# Patient Record
Sex: Male | Born: 1970 | Race: Black or African American | Hispanic: No | Marital: Single | Smoking: Current every day smoker
Health system: Southern US, Community
[De-identification: ages and names within clinical notes are randomized; demographics above are authoritative.]

## PROBLEM LIST (undated history)

## (undated) DIAGNOSIS — J189 Pneumonia, unspecified organism: Secondary | ICD-10-CM

## (undated) DIAGNOSIS — G20A1 Parkinson's disease without dyskinesia, without mention of fluctuations: Secondary | ICD-10-CM

## (undated) DIAGNOSIS — F952 Tourette's disorder: Secondary | ICD-10-CM

## (undated) DIAGNOSIS — M5430 Sciatica, unspecified side: Secondary | ICD-10-CM

## (undated) DIAGNOSIS — K219 Gastro-esophageal reflux disease without esophagitis: Secondary | ICD-10-CM

## (undated) DIAGNOSIS — I10 Essential (primary) hypertension: Secondary | ICD-10-CM

## (undated) DIAGNOSIS — M1990 Unspecified osteoarthritis, unspecified site: Secondary | ICD-10-CM

## (undated) DIAGNOSIS — G20 Parkinson's disease: Secondary | ICD-10-CM

## (undated) DIAGNOSIS — R0600 Dyspnea, unspecified: Secondary | ICD-10-CM

## (undated) DIAGNOSIS — F32A Depression, unspecified: Secondary | ICD-10-CM

## (undated) DIAGNOSIS — R519 Headache, unspecified: Secondary | ICD-10-CM

## (undated) HISTORY — PX: HERNIA REPAIR: SHX51

## (undated) HISTORY — DX: Parkinson's disease: G20

---

## 1998-04-23 ENCOUNTER — Emergency Department (HOSPITAL_COMMUNITY): Admission: EM | Admit: 1998-04-23 | Discharge: 1998-04-23 | Payer: Self-pay | Admitting: Emergency Medicine

## 1998-06-11 ENCOUNTER — Emergency Department (HOSPITAL_COMMUNITY): Admission: EM | Admit: 1998-06-11 | Discharge: 1998-06-11 | Payer: Self-pay | Admitting: Emergency Medicine

## 1998-06-28 ENCOUNTER — Ambulatory Visit (HOSPITAL_BASED_OUTPATIENT_CLINIC_OR_DEPARTMENT_OTHER): Admission: RE | Admit: 1998-06-28 | Discharge: 1998-06-28 | Payer: Self-pay | Admitting: General Surgery

## 2000-11-24 ENCOUNTER — Emergency Department (HOSPITAL_COMMUNITY): Admission: EM | Admit: 2000-11-24 | Discharge: 2000-11-24 | Payer: Self-pay | Admitting: Emergency Medicine

## 2003-04-30 ENCOUNTER — Emergency Department (HOSPITAL_COMMUNITY): Admission: EM | Admit: 2003-04-30 | Discharge: 2003-04-30 | Payer: Self-pay | Admitting: Emergency Medicine

## 2008-12-12 ENCOUNTER — Emergency Department (HOSPITAL_COMMUNITY): Admission: EM | Admit: 2008-12-12 | Discharge: 2008-12-12 | Payer: Self-pay | Admitting: Emergency Medicine

## 2010-02-12 ENCOUNTER — Emergency Department (HOSPITAL_COMMUNITY): Admission: EM | Admit: 2010-02-12 | Discharge: 2010-02-12 | Payer: Self-pay | Admitting: Emergency Medicine

## 2010-07-31 IMAGING — CR DG FINGER THUMB 2+V*L*
4 series · 4 of 4 positions shown · non-contrast
Comparison: None.

CLINICAL DATA: Pain swelling and redness with no known injury

LEFT THUMB 2+V

[x finger pa left *]
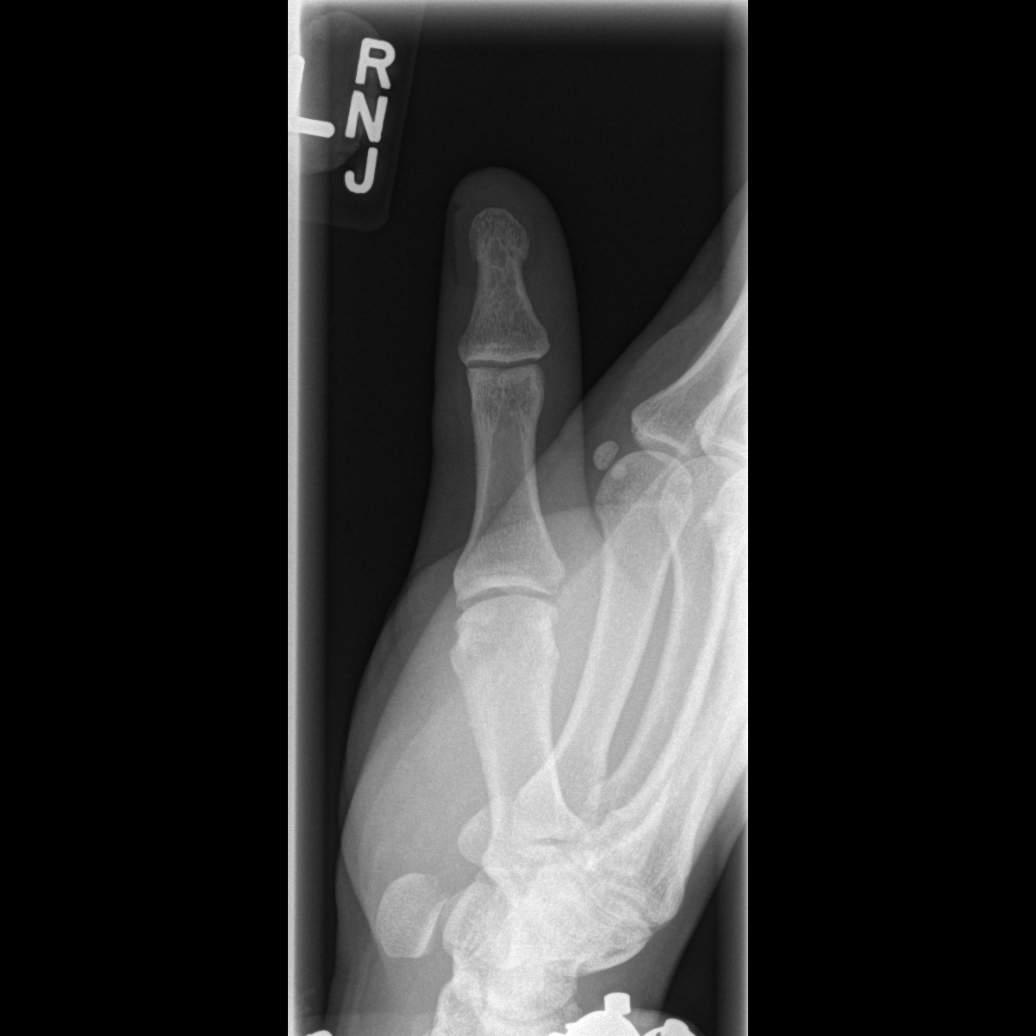

[x finger obl. left (1 of 2)]
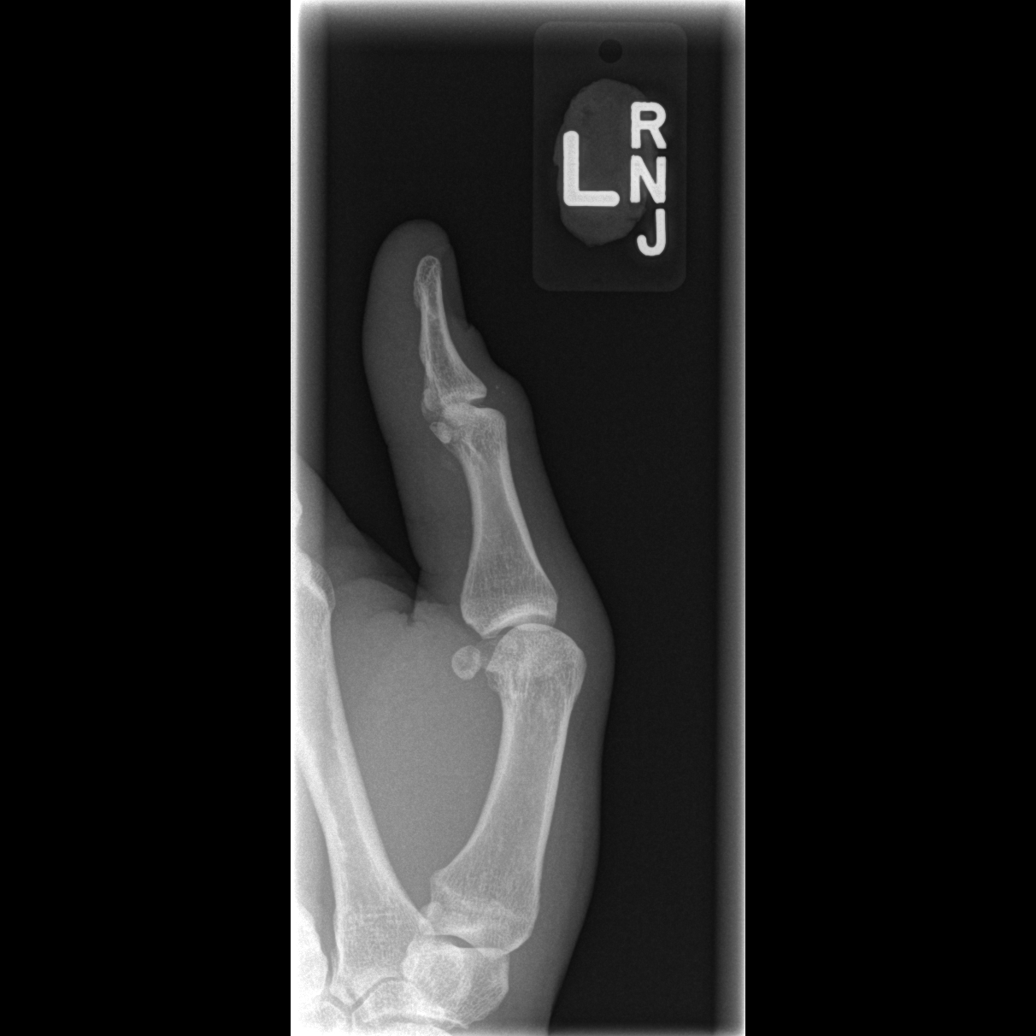

[x finger lateral left]
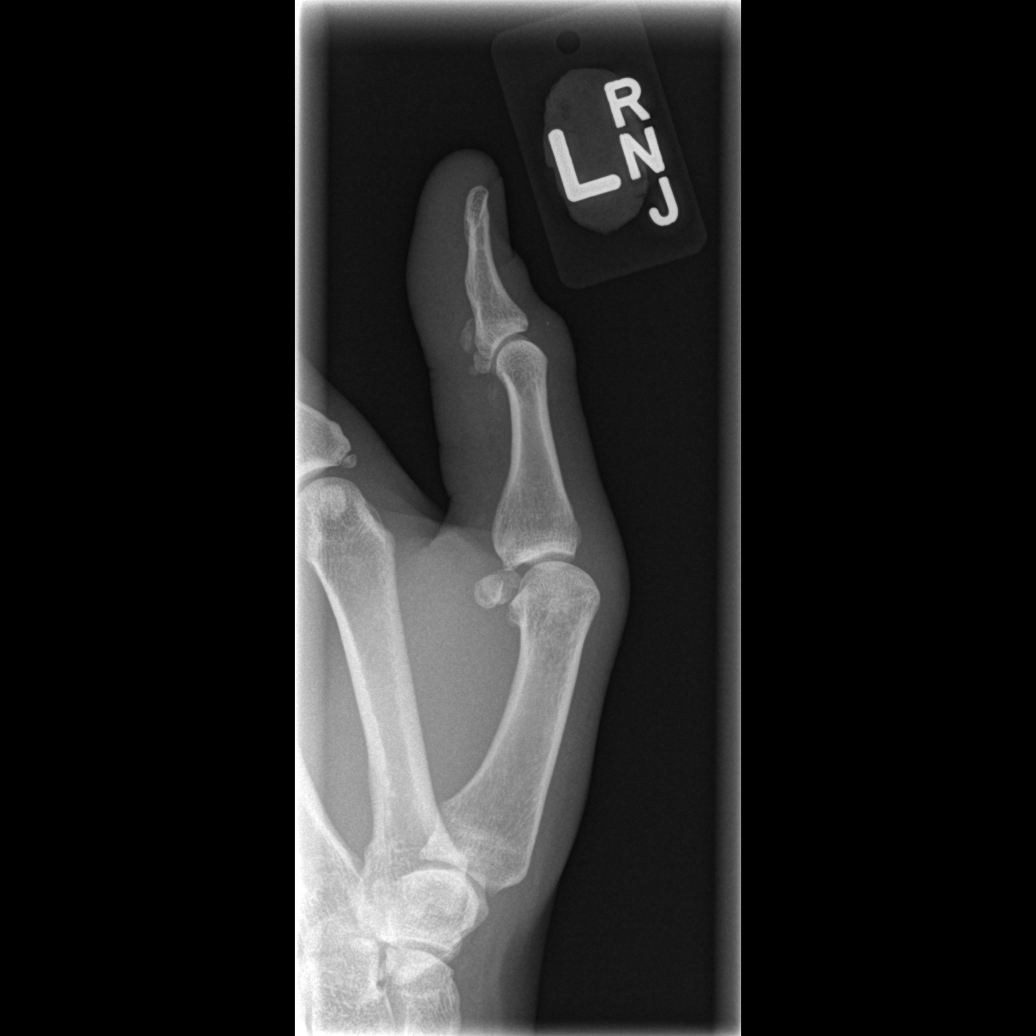

[x finger obl. left (2 of 2)]
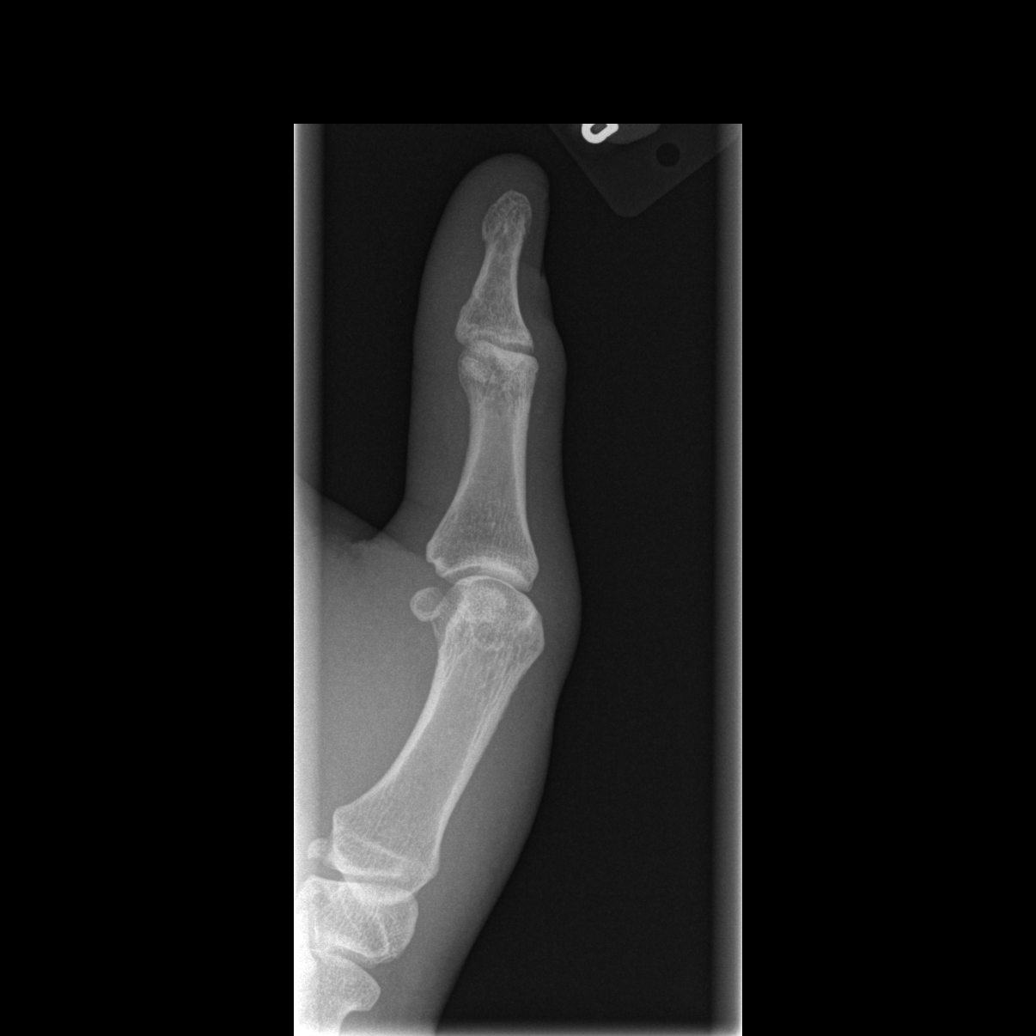

[4 of 4 positions shown; findings below may reference images not displayed]

FINDINGS: No bony abnormality.  On the lateral view, there is soft
tissue swelling over the IP joint.  There is a possible small
radiopaque foreign body in the soft tissues that may be glass.
This could be an artifact but needs clinical correlation.  A
lateral view of the thumb could be repeated to see if this density
persists.  However it is suspicious for piece of glass in the soft
tissues.
IMPRESSION: Possible small piece of glass in the soft tissues dorsal to the
interphalangeal joint of the thumb.  Otherwise normal.  See report.

## 2010-11-18 LAB — CBC
Hemoglobin: 13.6 g/dL (ref 13.0–17.0)
MCHC: 32.8 g/dL (ref 30.0–36.0)
RBC: 4.53 MIL/uL (ref 4.22–5.81)
RDW: 14.9 % (ref 11.5–15.5)

## 2010-11-18 LAB — DIFFERENTIAL
Basophils Relative: 1 % (ref 0–1)
Eosinophils Absolute: 0.3 10*3/uL (ref 0.0–0.7)
Eosinophils Relative: 3 % (ref 0–5)
Lymphocytes Relative: 18 % (ref 12–46)
Monocytes Absolute: 1.4 10*3/uL — ABNORMAL HIGH (ref 0.1–1.0)
Monocytes Relative: 14 % — ABNORMAL HIGH (ref 3–12)
Neutro Abs: 6.9 10*3/uL (ref 1.7–7.7)

## 2010-12-01 ENCOUNTER — Emergency Department (HOSPITAL_COMMUNITY)
Admission: EM | Admit: 2010-12-01 | Discharge: 2010-12-01 | Disposition: A | Discharge status: Home / Self Care | Payer: Self-pay | Attending: Emergency Medicine | Admitting: Emergency Medicine

## 2010-12-01 DIAGNOSIS — H6120 Impacted cerumen, unspecified ear: Secondary | ICD-10-CM | POA: Insufficient documentation

## 2010-12-01 DIAGNOSIS — I10 Essential (primary) hypertension: Secondary | ICD-10-CM | POA: Insufficient documentation

## 2011-01-26 ENCOUNTER — Emergency Department (HOSPITAL_COMMUNITY)
Admission: EM | Admit: 2011-01-26 | Discharge: 2011-01-26 | Disposition: A | Discharge status: Home / Self Care | Payer: Self-pay | Attending: Emergency Medicine | Admitting: Emergency Medicine

## 2011-01-26 DIAGNOSIS — M549 Dorsalgia, unspecified: Secondary | ICD-10-CM | POA: Insufficient documentation

## 2011-01-26 DIAGNOSIS — I10 Essential (primary) hypertension: Secondary | ICD-10-CM | POA: Insufficient documentation

## 2011-04-02 ENCOUNTER — Emergency Department (HOSPITAL_COMMUNITY): Payer: Self-pay

## 2011-04-02 ENCOUNTER — Emergency Department (HOSPITAL_COMMUNITY)
Admission: EM | Admit: 2011-04-02 | Discharge: 2011-04-02 | Disposition: A | Discharge status: Home / Self Care | Payer: Self-pay | Attending: Emergency Medicine | Admitting: Emergency Medicine

## 2011-04-02 DIAGNOSIS — I10 Essential (primary) hypertension: Secondary | ICD-10-CM | POA: Insufficient documentation

## 2011-04-02 DIAGNOSIS — R079 Chest pain, unspecified: Secondary | ICD-10-CM | POA: Insufficient documentation

## 2011-04-02 DIAGNOSIS — Y9367 Activity, basketball: Secondary | ICD-10-CM | POA: Insufficient documentation

## 2011-04-02 DIAGNOSIS — Y92838 Other recreation area as the place of occurrence of the external cause: Secondary | ICD-10-CM | POA: Insufficient documentation

## 2011-04-02 DIAGNOSIS — S298XXA Other specified injuries of thorax, initial encounter: Secondary | ICD-10-CM | POA: Insufficient documentation

## 2011-04-02 DIAGNOSIS — X58XXXA Exposure to other specified factors, initial encounter: Secondary | ICD-10-CM | POA: Insufficient documentation

## 2011-04-02 DIAGNOSIS — Y9239 Other specified sports and athletic area as the place of occurrence of the external cause: Secondary | ICD-10-CM | POA: Insufficient documentation

## 2011-06-16 ENCOUNTER — Emergency Department (HOSPITAL_COMMUNITY)
Admission: EM | Admit: 2011-06-16 | Discharge: 2011-06-16 | Disposition: A | Discharge status: Home / Self Care | Payer: Self-pay | Attending: Emergency Medicine | Admitting: Emergency Medicine

## 2011-06-16 DIAGNOSIS — I10 Essential (primary) hypertension: Secondary | ICD-10-CM | POA: Insufficient documentation

## 2011-06-16 DIAGNOSIS — S61209A Unspecified open wound of unspecified finger without damage to nail, initial encounter: Secondary | ICD-10-CM | POA: Insufficient documentation

## 2011-06-16 DIAGNOSIS — M79609 Pain in unspecified limb: Secondary | ICD-10-CM | POA: Insufficient documentation

## 2011-06-16 DIAGNOSIS — IMO0002 Reserved for concepts with insufficient information to code with codable children: Secondary | ICD-10-CM | POA: Insufficient documentation

## 2011-06-16 DIAGNOSIS — W278XXA Contact with other nonpowered hand tool, initial encounter: Secondary | ICD-10-CM | POA: Insufficient documentation

## 2011-09-18 IMAGING — CR DG RIBS W/ CHEST 3+V*R*
5 series · 5 of 5 positions shown · non-contrast
Comparison: None

CLINICAL DATA: Injury with right chest and rib pain.  Shortness of
breath.

RIGHT RIBS AND CHEST - 3+ VIEW

[w chest pa (1 of 2)]
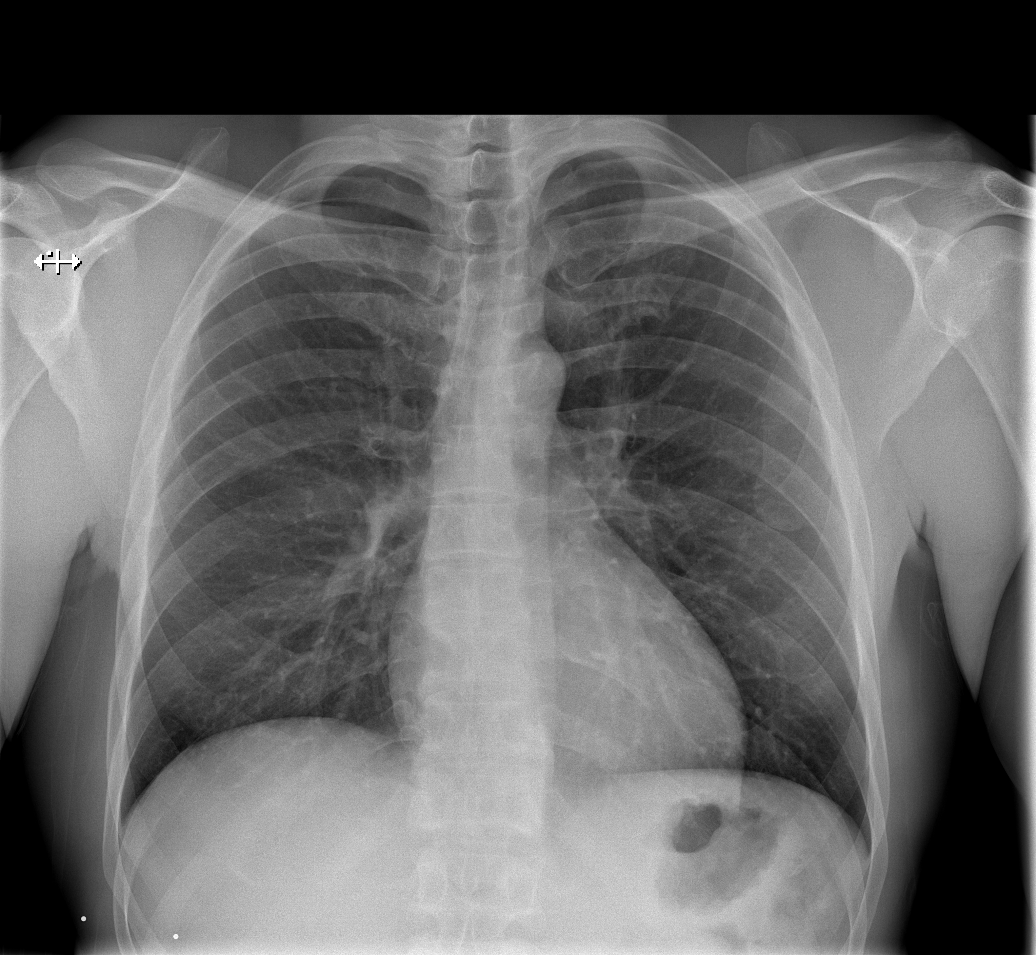

[w chest pa (2 of 2)]
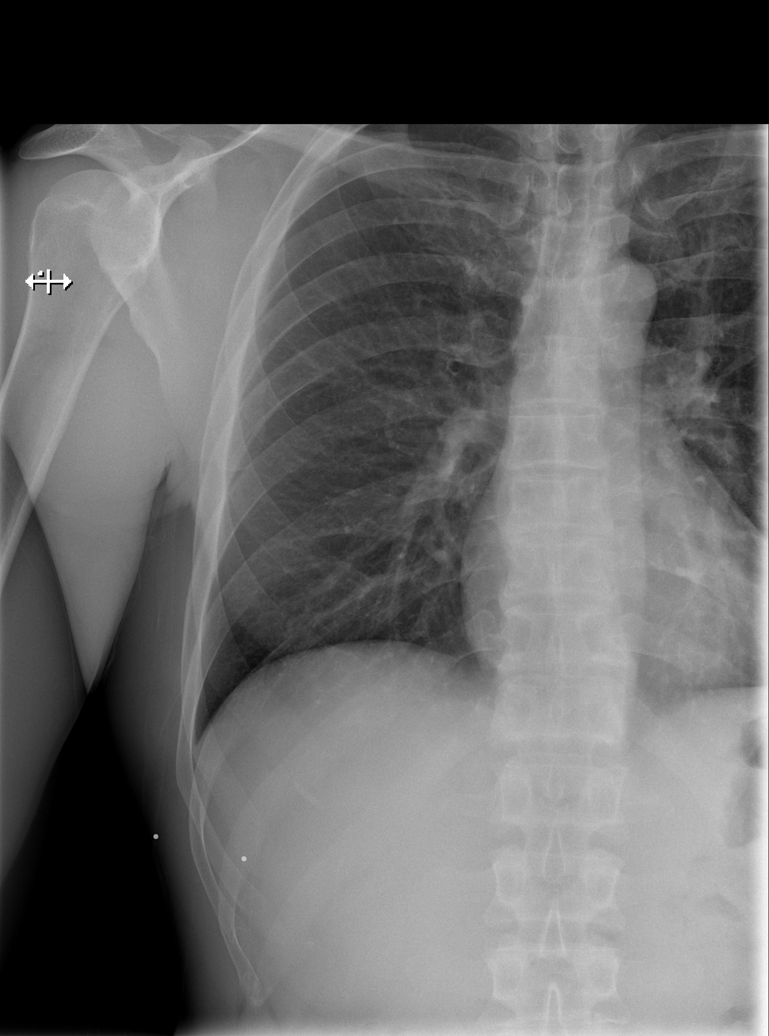

[w ribs ap lower right]
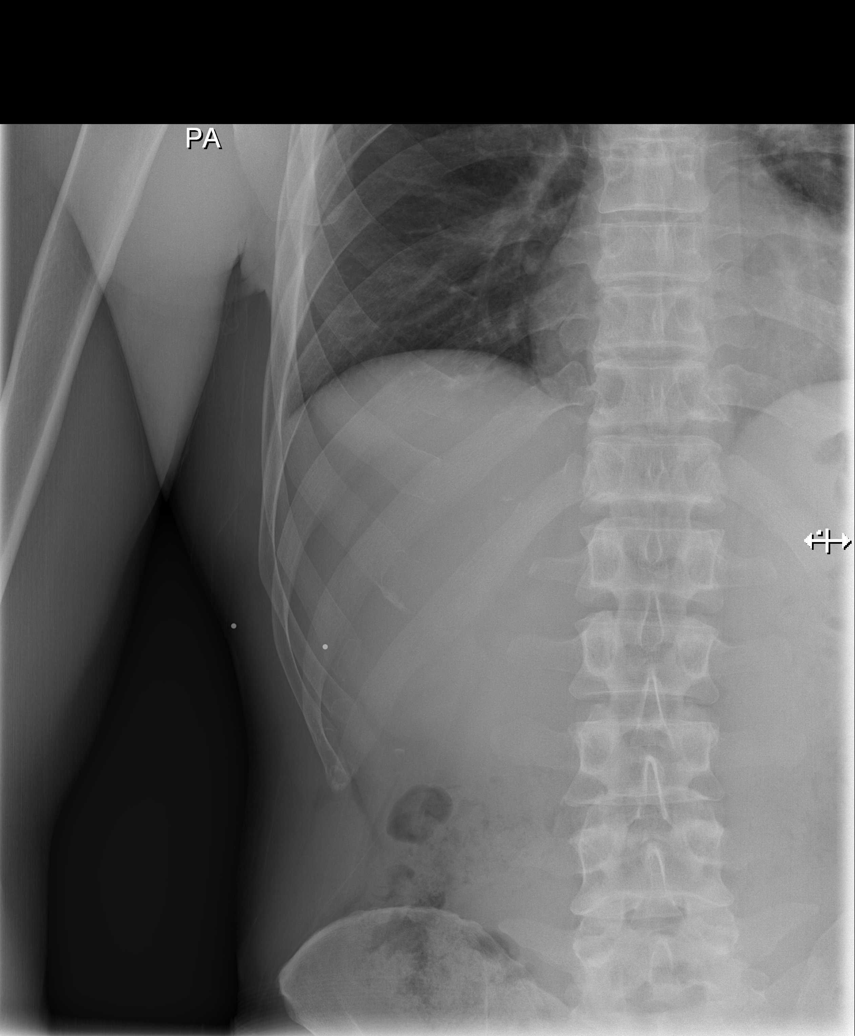

[w ribs obl right (1 of 2)]
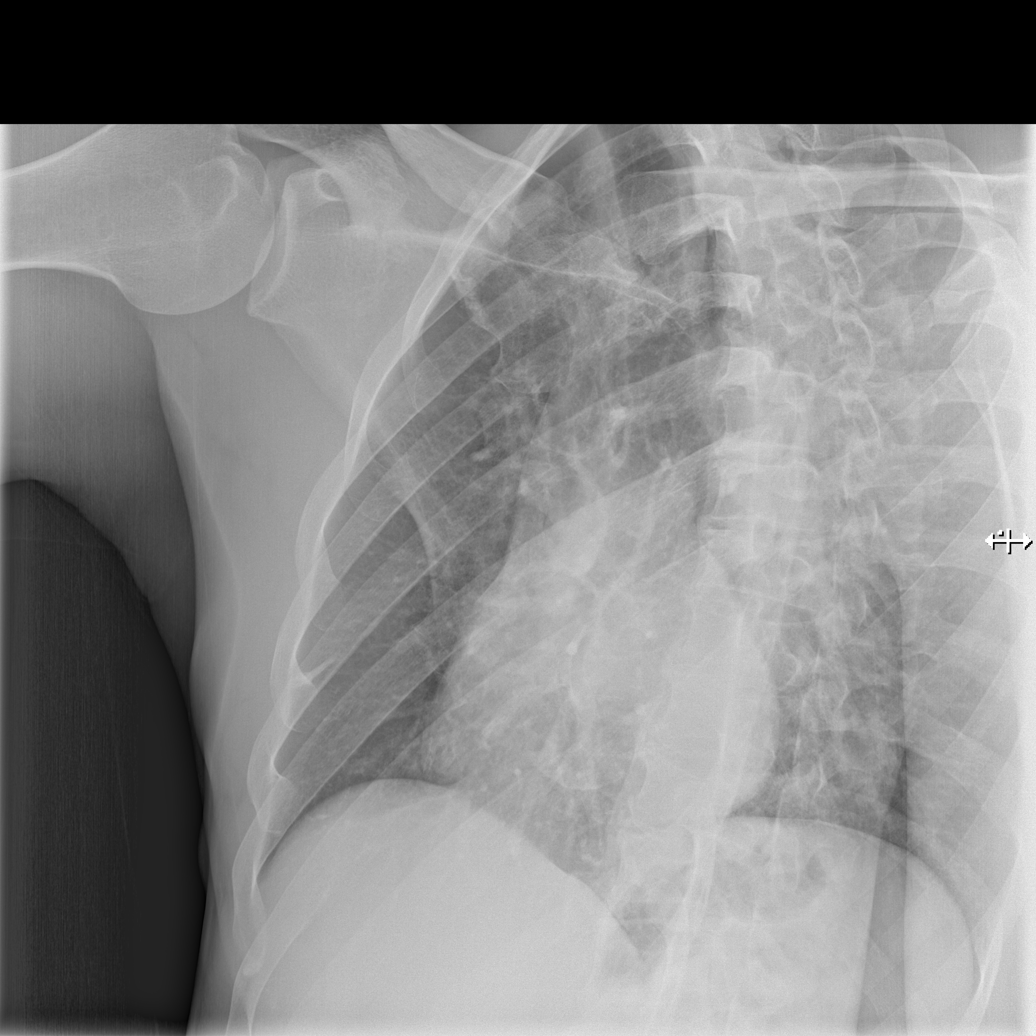

[w ribs obl right (2 of 2)]
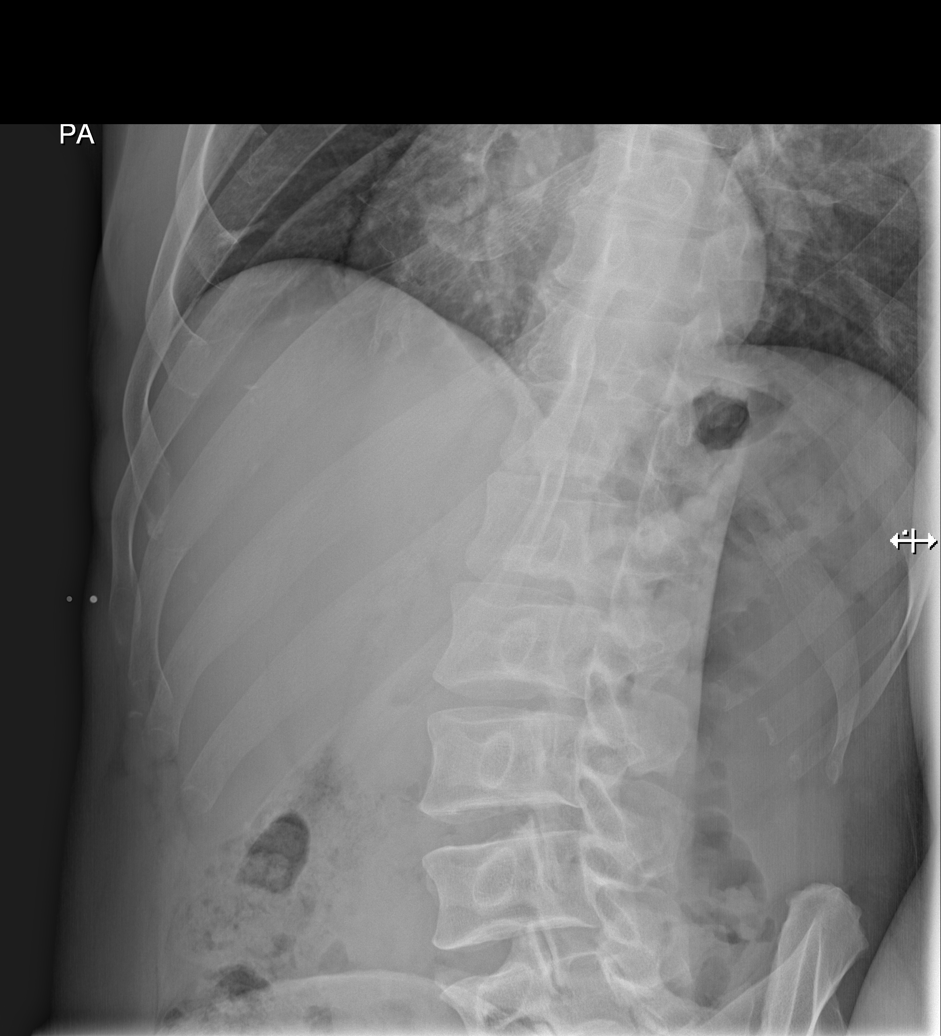

[5 of 5 positions shown; findings below may reference images not displayed]

FINDINGS: The cardiomediastinal silhouette is unremarkable.
The lungs are clear.
There is no evidence of focal airspace disease, pulmonary edema,
pulmonary nodule/mass, pleural effusion, or pneumothorax.
No acute bony abnormalities are identified.
There is no evidence of rib fracture.
IMPRESSION: No evidence of active cardiopulmonary disease or rib fracture.

## 2012-09-08 ENCOUNTER — Encounter (HOSPITAL_COMMUNITY): Payer: Self-pay | Admitting: *Deleted

## 2012-09-08 ENCOUNTER — Emergency Department (HOSPITAL_COMMUNITY)
Admission: EM | Admit: 2012-09-08 | Discharge: 2012-09-08 | Disposition: A | Discharge status: Home / Self Care | Payer: Self-pay | Attending: Emergency Medicine | Admitting: Emergency Medicine

## 2012-09-08 DIAGNOSIS — F17200 Nicotine dependence, unspecified, uncomplicated: Secondary | ICD-10-CM | POA: Insufficient documentation

## 2012-09-08 DIAGNOSIS — K047 Periapical abscess without sinus: Secondary | ICD-10-CM

## 2012-09-08 DIAGNOSIS — R220 Localized swelling, mass and lump, head: Secondary | ICD-10-CM | POA: Insufficient documentation

## 2012-09-08 MED ORDER — HYDROCODONE-ACETAMINOPHEN 5-325 MG PO TABS: 1.0000 | ORAL_TABLET | Freq: Four times a day (QID) | ORAL | Status: DC | PRN

## 2012-09-08 MED ORDER — PENICILLIN V POTASSIUM 250 MG PO TABS: 500.0000 mg | ORAL_TABLET | Freq: Four times a day (QID) | ORAL | Status: DC

## 2012-09-08 MED ORDER — BUPIVACAINE-EPINEPHRINE PF 0.5-1:200000 % IJ SOLN
1.8000 mL | Freq: Once | INTRAMUSCULAR | Status: AC
  Administered 2012-09-08: 9 mg

## 2012-09-08 NOTE — ED Notes (Signed)
PA- Paz at bedside for drainage of dental abscess.

## 2012-09-08 NOTE — ED Provider Notes (Signed)
Medical screening examination/treatment/procedure(s) were performed by non-physician practitioner and as supervising physician I was immediately available for consultation/collaboration.   Charles B. Sheldon, MD 09/08/12 1958 

## 2012-09-08 NOTE — ED Provider Notes (Signed)
History     CSN: [id number]  Arrival date & time 09/08/12  1053   First MD Initiated Contact with Patient 09/08/12 1100      Chief Complaint  Patient presents with  . Dental Pain    (Consider location/radiation/quality/duration/timing/severity/associated sxs/prior treatment) Patient is a 42 y.o. male presenting with tooth pain and abscess.  Dental PainPrimary symptoms do not include mouth pain, dental injury, fever, shortness of breath, sore throat or angioedema.  Additional symptoms include: dental sensitivity to temperature, gum swelling, gum tenderness and purulent gums. Additional symptoms do not include: trismus, jaw pain, facial swelling, trouble swallowing, pain with swallowing, excessive salivation, dry mouth, taste disturbance, smell disturbance, drooling, ear pain, hearing loss, nosebleeds, swollen glands, goiter and fatigue.   Abscess  This is a new problem. The current episode started yesterday. The problem has been gradually worsening. Affected Location: left upper dental. The problem is moderate. The abscess is characterized by swelling and painfulness. Pertinent negatives include no fever and no sore throat. There were no sick contacts. He has received no recent medical care.    History reviewed. No pertinent past medical history.  History reviewed. No pertinent past surgical history.  History reviewed. No pertinent family history.  History  Substance Use Topics  . Smoking status: Current Every Day Smoker    Types: Cigarettes  . Smokeless tobacco: Not on file  . Alcohol Use: No      Review of Systems  Constitutional: Negative for fever and fatigue.  HENT: Positive for dental problem. Negative for hearing loss, ear pain, nosebleeds, sore throat, facial swelling, drooling and trouble swallowing.   Respiratory: Negative for shortness of breath.     Allergies  Shellfish allergy and Tylenol  Home Medications   Current Outpatient Rx  Name  Route  Sig   Dispense  Refill  . IBUPROFEN 200 MG PO TABS   Oral   Take 400 mg by mouth every 6 (six) hours as needed. For pain.           BP 140/75  Pulse 77  Temp 98.9 F (37.2 C) (Oral)  Resp 18  SpO2 100%  Physical Exam  Nursing note and vitals reviewed. Constitutional: He is oriented to person, place, and time. He appears well-developed and well-nourished. No distress.  HENT:  Head: Normocephalic and atraumatic. No trismus in the jaw.  Mouth/Throat: Uvula is midline, oropharynx is clear and moist and mucous membranes are normal. Abnormal dentition. No dental abscesses or uvula swelling. No oropharyngeal exudate, posterior oropharyngeal edema, posterior oropharyngeal erythema or tonsillar abscesses.          Pt able to open and close mouth with out difficulty. Airway intact. Uvula midline. Gingival swelling, tenderness, and fluctuance over affected area- 2cm palpable abscess. No swelling or tenderness of submental and submandibular regions.  Eyes: Conjunctivae normal and EOM are normal.  Neck: Normal range of motion and full passive range of motion without pain. Neck supple.  Cardiovascular: Normal rate and regular rhythm.   Pulmonary/Chest: Effort normal and breath sounds normal. No stridor. No respiratory distress. He has no wheezes.  Musculoskeletal: Normal range of motion.  Lymphadenopathy:       Head (right side): No submental, no submandibular, no tonsillar, no preauricular and no posterior auricular adenopathy present.       Head (left side): No submental, no submandibular, no tonsillar, no preauricular and no posterior auricular adenopathy present.    He has no cervical adenopathy.  Neurological: He is alert and oriented  to person, place, and time.  Skin: Skin is warm and dry. No rash noted. He is not diaphoretic.    ED Course  INCISION AND DRAINAGE Date/Time: 09/08/2012 11:58 AM Performed by: Jaci Carrel Authorized by: Jaci Carrel Consent: Verbal consent obtained. Risks  and benefits: risks, benefits and alternatives were discussed Consent given by: patient Patient understanding: patient states understanding of the procedure being performed Patient identity confirmed: verbally with patient and arm band Type: abscess Body area: mouth Location details: vestibule of mouth Anesthesia: local infiltration Local anesthetic: bupivacaine 0.5% with epinephrine Anesthetic total: 1.8 ml Patient sedated: no Scalpel size: 11 Needle gauge: 18 Incision type: single straight Complexity: simple Drainage: purulent Drainage amount: copious Wound treatment: wound left open Packing material: none Patient tolerance: Patient tolerated the procedure well with no immediate complications.   (including critical care time)  Labs Reviewed - No data to display No results found.   No diagnosis found.    MDM  Dental abscess  Patient with dental abscess amenable to incision and drainage.  Exam unconcerning for Ludwig's angina or spread of infection.  Will treat with penicillin and pain medicine.  Urged patient to follow-up with dentist for wound recheck in 2 days. Encouraged home warm soaks and flushing.          Jaci Carrel, [place] 09/08/12 1202

## 2012-09-08 NOTE — Discharge Instructions (Signed)
Dental Abscess  A dental abscess is a collection of infected fluid (pus) from a bacterial infection in the inner part of the tooth (pulp). It usually occurs at the end of the tooth's root.   CAUSES    Severe tooth decay.   Trauma to the tooth that allows bacteria to enter into the pulp, such as a broken or chipped tooth.  SYMPTOMS    Severe pain in and around the infected tooth.   Swelling and redness around the abscessed tooth or in the mouth or face.   Tenderness.   Pus drainage.   Bad breath.   Bitter taste in the mouth.   Difficulty swallowing.   Difficulty opening the mouth.   Nausea.   Vomiting.   Chills.   Swollen neck glands.  DIAGNOSIS    A medical and dental history will be taken.   An examination will be performed by tapping on the abscessed tooth.   X-rays may be taken of the tooth to identify the abscess.  TREATMENT  The goal of treatment is to eliminate the infection. You may be prescribed antibiotic medicine to stop the infection from spreading. A root canal may be performed to save the tooth. If the tooth cannot be saved, it may be pulled (extracted) and the abscess may be drained.   HOME CARE INSTRUCTIONS   Only take over-the-counter or prescription medicines for pain, fever, or discomfort as directed by your caregiver.   Rinse your mouth (gargle) often with salt water ( tsp salt in 8 oz of warm water) to relieve pain or swelling.   Do not drive after taking pain medicine (narcotics).   Do not apply heat to the outside of your face.   Return to your dentist for further treatment as directed.  SEEK MEDICAL CARE IF:   Your pain is not helped by medicine.   Your pain is getting worse instead of better.  SEEK IMMEDIATE MEDICAL CARE IF:   You have a fever or persistent symptoms for more than 2 3 days.   You have a fever and your symptoms suddenly get worse.   You have chills or a very bad headache.   You have problems breathing or swallowing.   You have trouble opening your  mouth.   You have swelling in the neck or around the eye.  Document Released: 08/18/2005 Document Revised: 02/17/2012 Document Reviewed: 11/26/2010  [employer] Patient Information 2013 [employer].

## 2012-09-08 NOTE — ED Notes (Signed)
Reports left upper dental abscess x 2 days. Airway intact

## 2012-09-08 NOTE — ED Notes (Signed)
Left upper gum toward back molars is swollen and red. Left lateral upper tooth is black in appearance. A&Ox4, ambulatory, nad.

## 2013-06-03 ENCOUNTER — Emergency Department (HOSPITAL_COMMUNITY): Payer: Self-pay

## 2013-06-03 ENCOUNTER — Emergency Department (HOSPITAL_COMMUNITY)
Admission: EM | Admit: 2013-06-03 | Discharge: 2013-06-03 | Disposition: A | Discharge status: Home / Self Care | Payer: Self-pay | Attending: Emergency Medicine | Admitting: Emergency Medicine

## 2013-06-03 ENCOUNTER — Encounter (HOSPITAL_COMMUNITY): Payer: Self-pay | Admitting: Emergency Medicine

## 2013-06-03 DIAGNOSIS — T07XXXA Unspecified multiple injuries, initial encounter: Secondary | ICD-10-CM | POA: Insufficient documentation

## 2013-06-03 DIAGNOSIS — F17200 Nicotine dependence, unspecified, uncomplicated: Secondary | ICD-10-CM | POA: Insufficient documentation

## 2013-06-03 DIAGNOSIS — S5011XA Contusion of right forearm, initial encounter: Secondary | ICD-10-CM

## 2013-06-03 DIAGNOSIS — IMO0002 Reserved for concepts with insufficient information to code with codable children: Secondary | ICD-10-CM | POA: Insufficient documentation
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
## 2013-06-04 NOTE — ED Provider Notes (Signed)
Medical screening examination/treatment/procedure(s) were performed by non-physician practitioner and as supervising physician I was immediately available for consultation/collaboration.  Hurman Horn, MD 06/04/13 [phone]

## 2013-11-19 IMAGING — CR DG HUMERUS 2V *R*
4 series · 4 of 4 positions shown · non-contrast
Comparison: None.

CLINICAL DATA: Arm pain, laceration

EXAM:
RIGHT HUMERUS - 2+ VIEW

[w humerus ap right (1 of 2)]
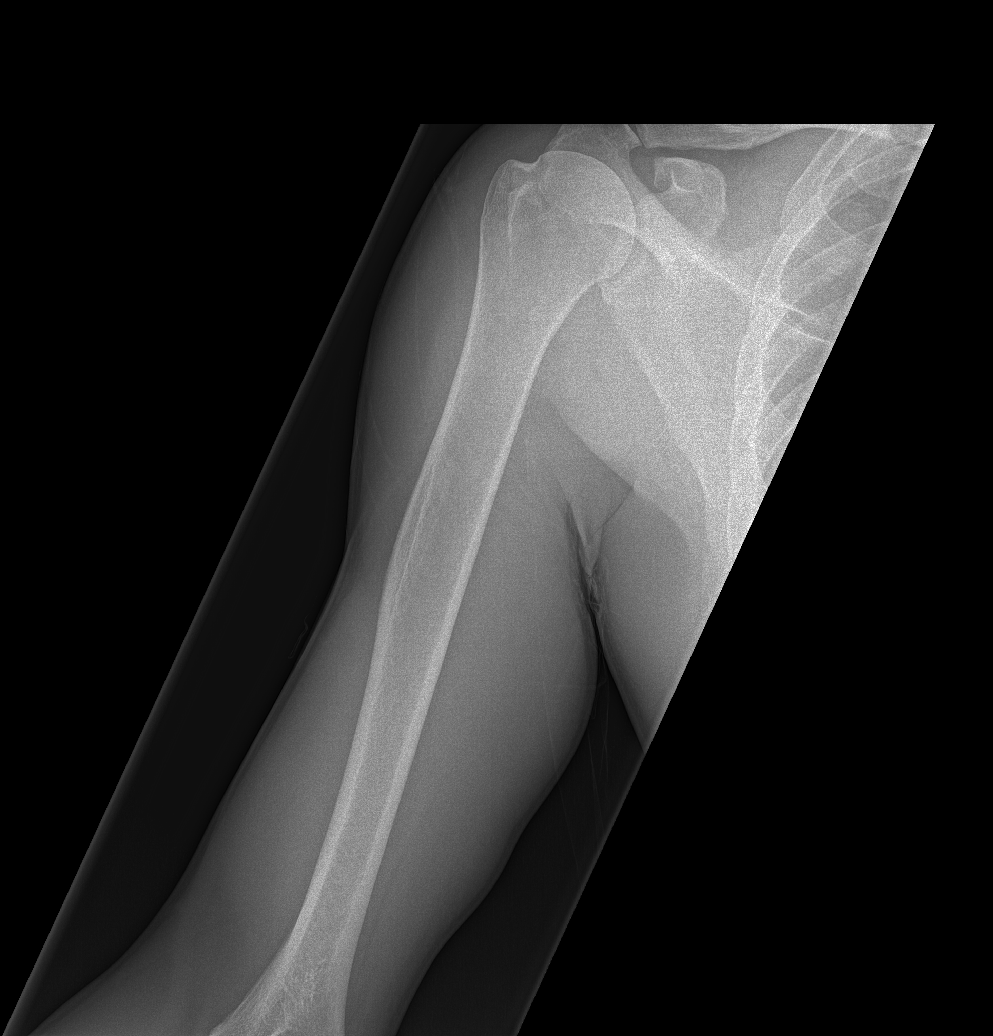

[w humerus ap right (2 of 2)]
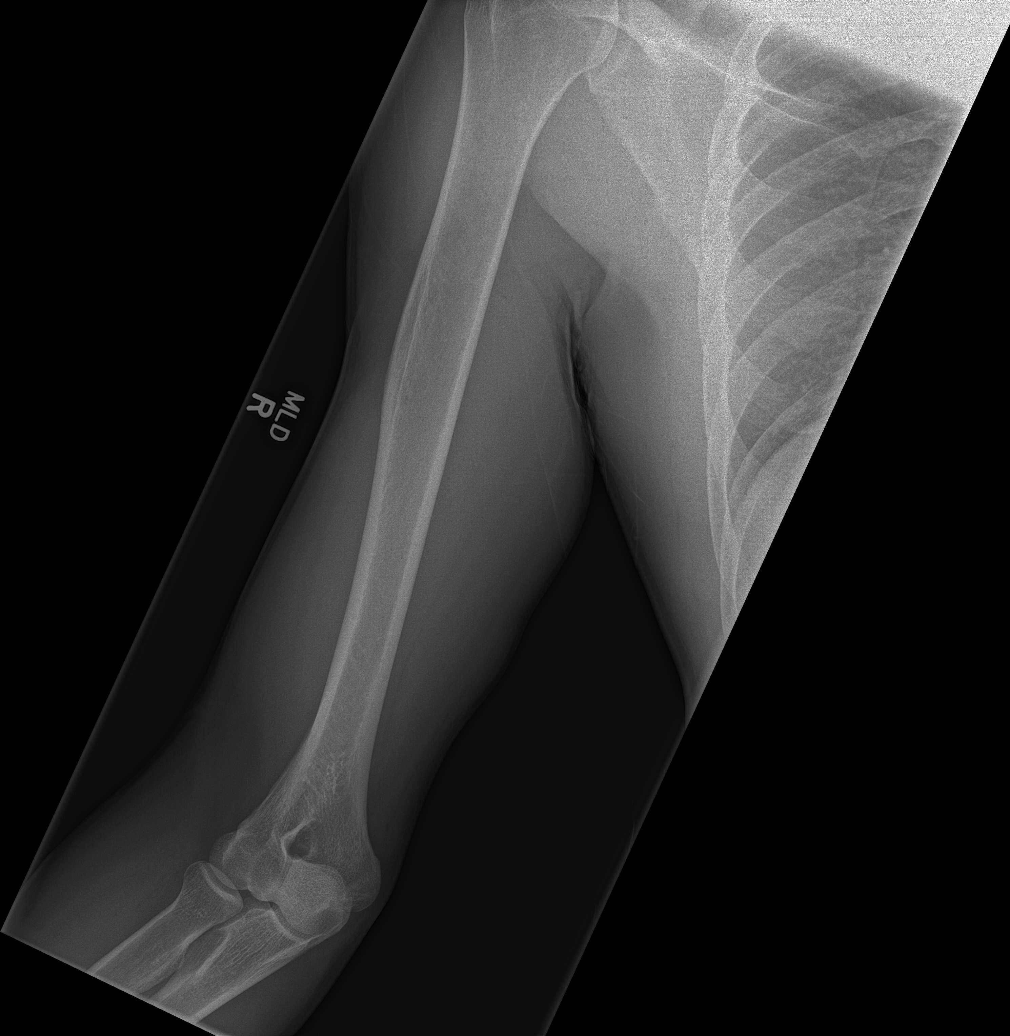

[w humerus lat right (1 of 2)]
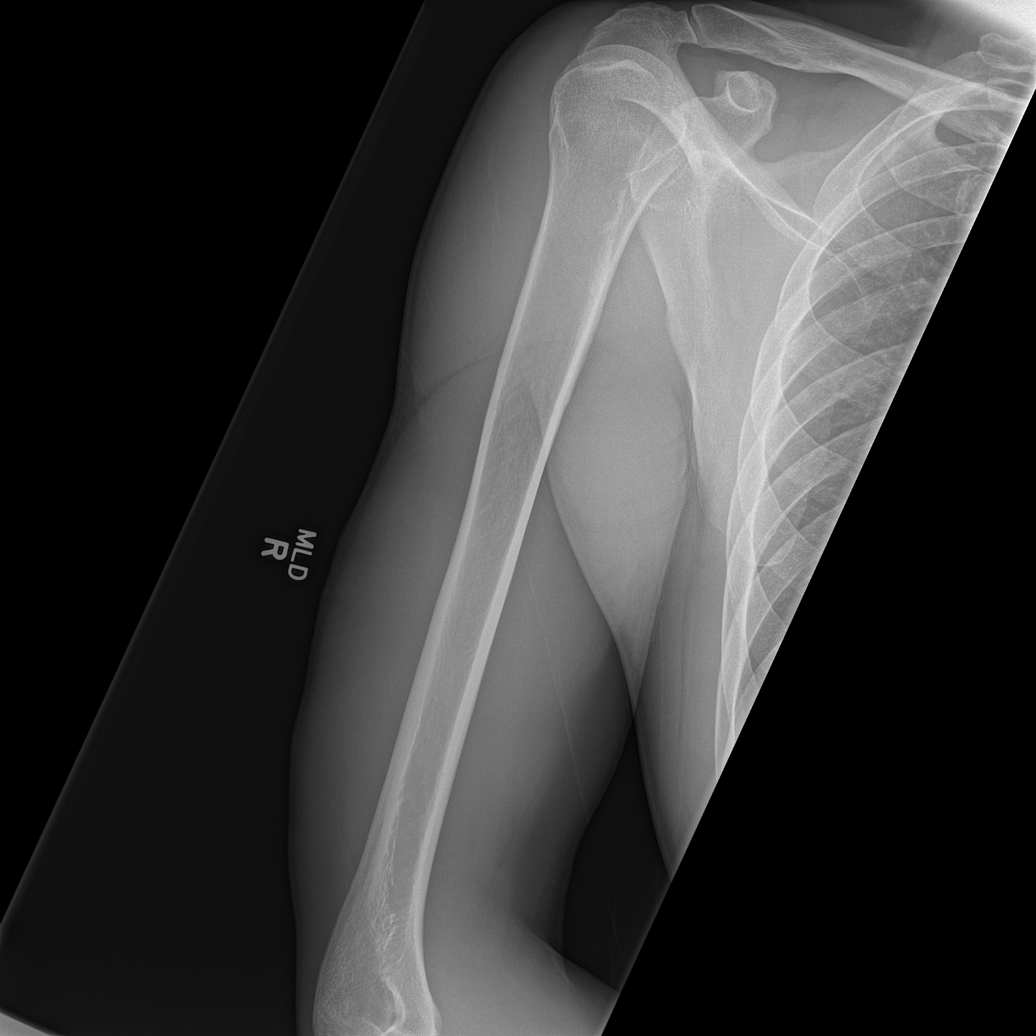

[w humerus lat right (2 of 2)]
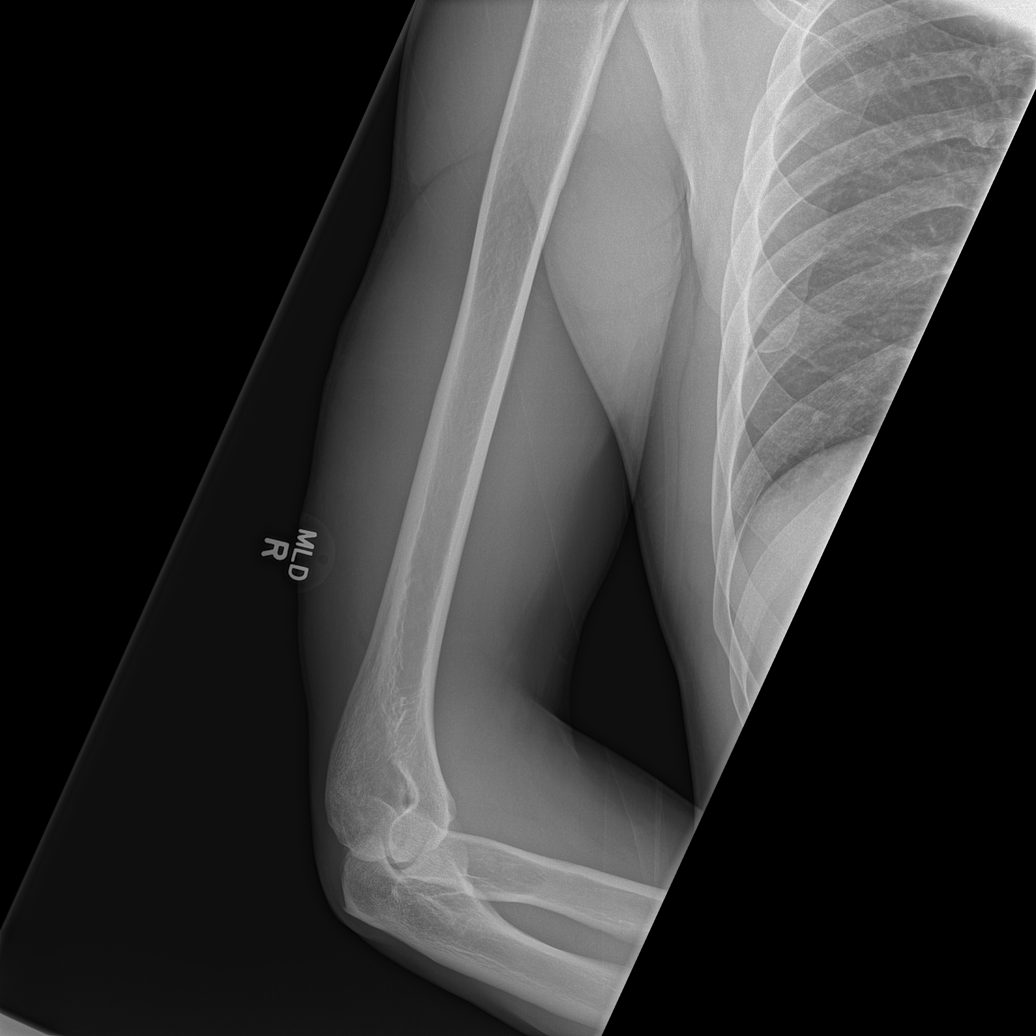

[4 of 4 positions shown; findings below may reference images not displayed]

FINDINGS: There is no evidence of fracture or other focal bone lesions. Soft
tissues are unremarkable.
IMPRESSION: Negative.

## 2013-11-19 IMAGING — CR DG FOREARM 2V*R*
2 series · 2 of 2 positions shown · non-contrast
Comparison: None.

CLINICAL DATA: Generalized arm pain. Laceration.

EXAM:
RIGHT FOREARM - 2 VIEW

[x forearm ap right]
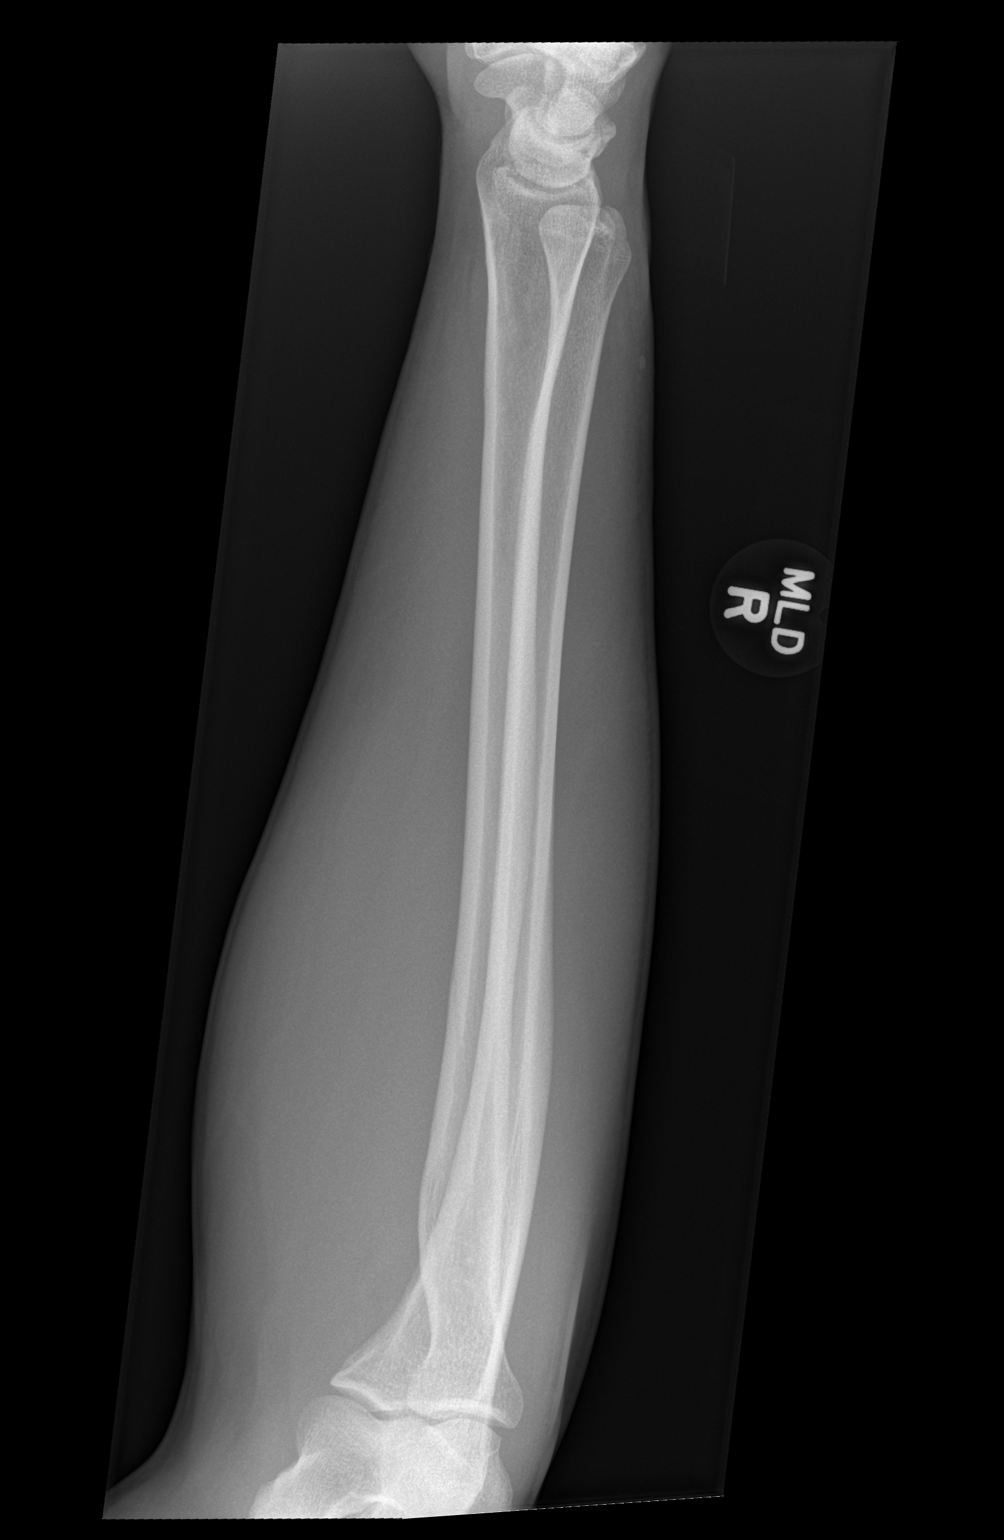

[x forearm lat right]
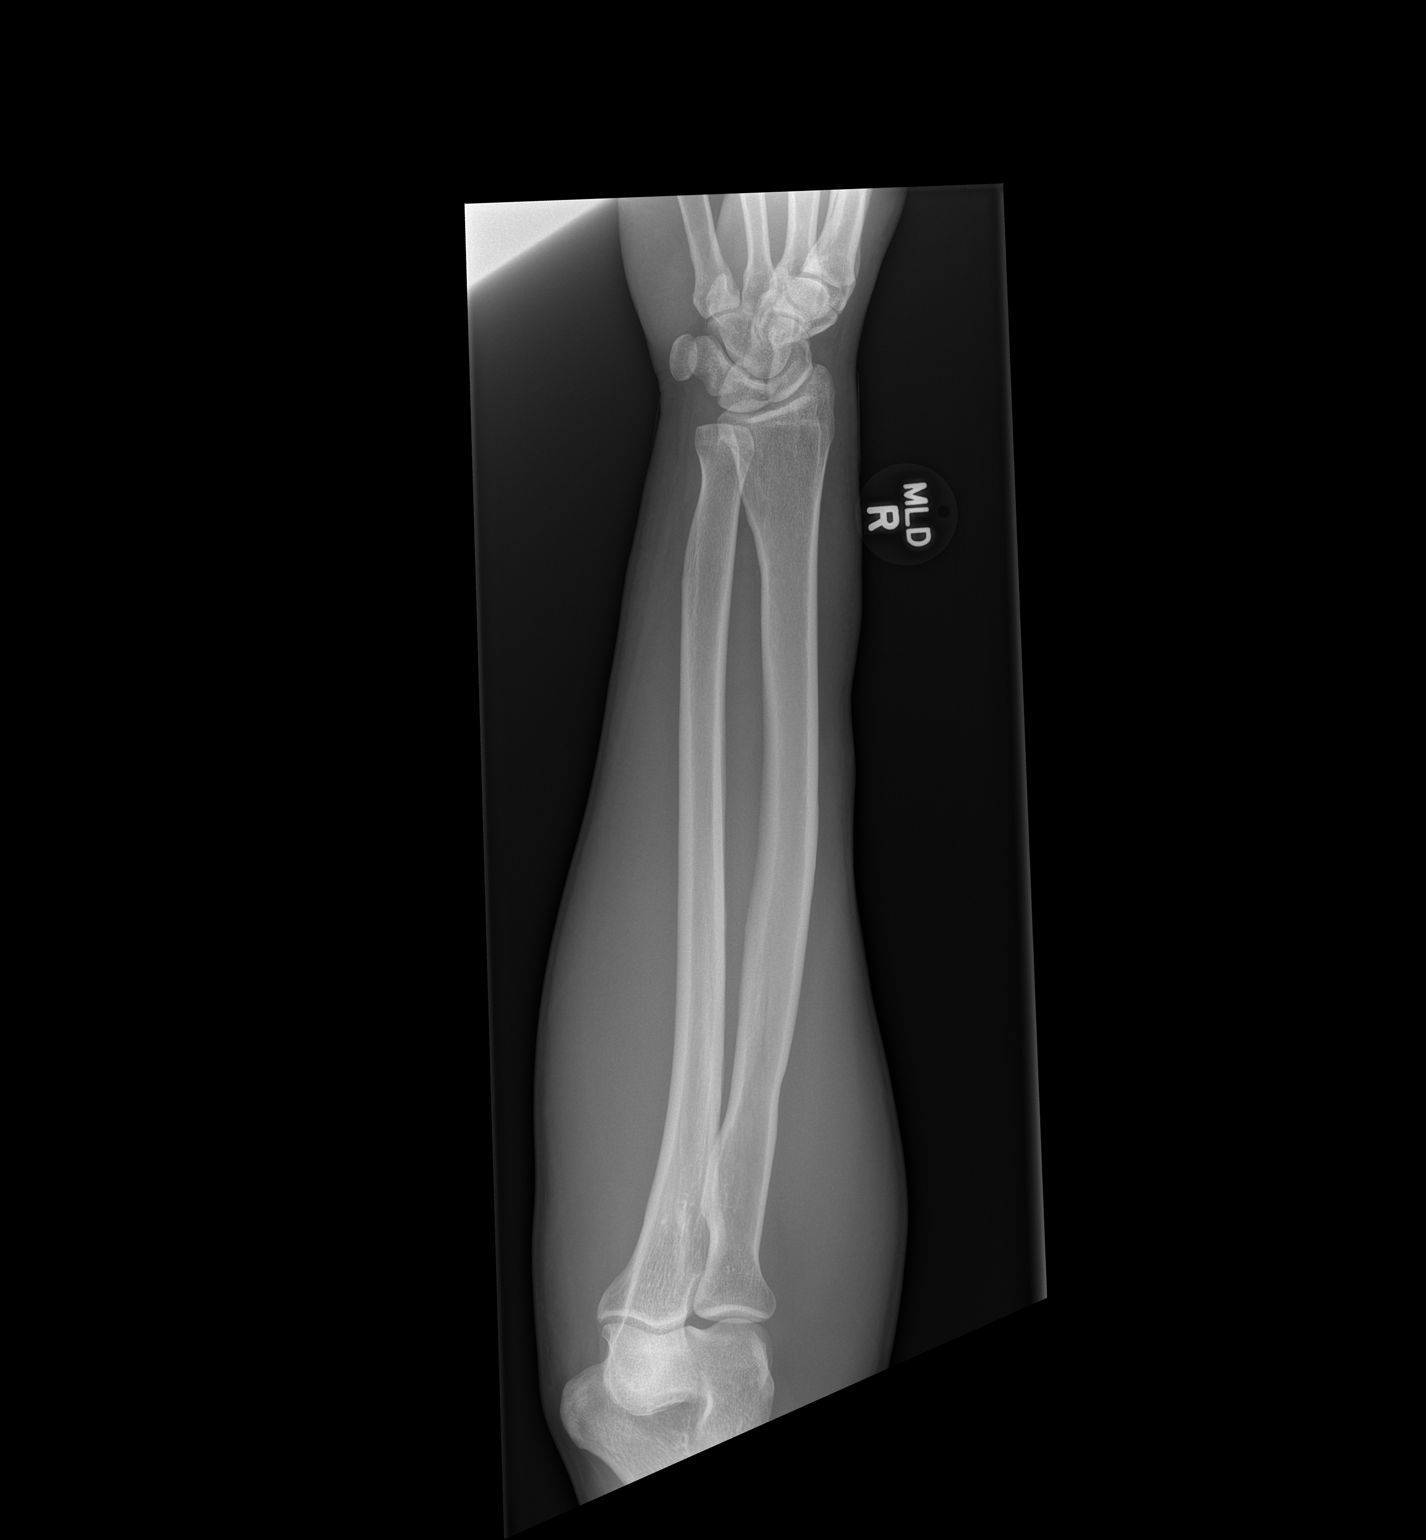

[2 of 2 positions shown; findings below may reference images not displayed]

FINDINGS: There is no evidence of fracture or other focal bone lesions. No
definitive radiodense foreign body.
IMPRESSION: Negative for acute osseous injury.

## 2014-08-14 ENCOUNTER — Emergency Department (HOSPITAL_COMMUNITY)
Admission: EM | Admit: 2014-08-14 | Discharge: 2014-08-14 | Disposition: A | Discharge status: Home / Self Care | Payer: Self-pay | Attending: Emergency Medicine | Admitting: Emergency Medicine

## 2014-08-14 ENCOUNTER — Encounter (HOSPITAL_COMMUNITY): Payer: Self-pay | Admitting: Physical Medicine and Rehabilitation

## 2014-08-14 DIAGNOSIS — K088 Other specified disorders of teeth and supporting structures: Secondary | ICD-10-CM | POA: Insufficient documentation

## 2014-08-14 DIAGNOSIS — Z720 Tobacco use: Secondary | ICD-10-CM | POA: Insufficient documentation

## 2014-08-14 DIAGNOSIS — K0889 Other specified disorders of teeth and supporting structures: Secondary | ICD-10-CM

## 2014-08-14 MED ORDER — HYDROCODONE-ACETAMINOPHEN 5-325 MG PO TABS: 2.0000 | ORAL_TABLET | ORAL | Status: DC | PRN

## 2014-08-14 MED ORDER — ONDANSETRON HCL 4 MG PO TABS: 4.0000 mg | ORAL_TABLET | Freq: Four times a day (QID) | ORAL | Status: DC

## 2014-08-14 MED ORDER — PENICILLIN V POTASSIUM 500 MG PO TABS: 500.0000 mg | ORAL_TABLET | Freq: Four times a day (QID) | ORAL | Status: AC

## 2014-08-14 MED ORDER — BUPIVACAINE HCL (PF) 0.5 % IJ SOLN
5.0000 mL | Freq: Once | INTRAMUSCULAR | Status: AC
  Administered 2014-08-14: 5 mL
  Filled 2014-08-14: qty 5

## 2014-08-14 MED ORDER — FENTANYL CITRATE 0.05 MG/ML IJ SOLN
50.0000 ug | Freq: Once | INTRAMUSCULAR | Status: AC
  Administered 2014-08-14: 50 ug via NASAL
  Filled 2014-08-14: qty 2

## 2014-08-14 NOTE — Discharge Instructions (Signed)
Dental Care and Dentist Visits Dental care supports good overall health. Regular dental visits can also help you avoid dental pain, bleeding, infection, and other more serious health problems in the future. It is important to keep the mouth healthy because diseases in the teeth, gums, and other oral tissues can spread to other areas of the body. Some problems, such as diabetes, heart disease, and pre-term labor have been associated with poor oral health.  See your dentist every 6 months. If you experience emergency problems such as a toothache or broken tooth, go to the dentist right away. If you see your dentist regularly, you may catch problems early. It is easier to be treated for problems in the early stages.  WHAT TO EXPECT AT A DENTIST VISIT  Your dentist will look for many common oral health problems and recommend proper treatment. At your regular dental visit, you can expect:  Gentle cleaning of the teeth and gums. This includes scraping and polishing. This helps to remove the sticky substance around the teeth and gums (plaque). Plaque forms in the mouth shortly after eating. Over time, plaque hardens on the teeth as tartar. If tartar is not removed regularly, it can cause problems. Cleaning also helps remove stains.  Periodic X-rays. These pictures of the teeth and supporting bone will help your dentist assess the health of your teeth.  Periodic fluoride treatments. Fluoride is a natural mineral shown to help strengthen teeth. Fluoride treatmentinvolves applying a fluoride gel or varnish to the teeth. It is most commonly done in children.  Examination of the mouth, tongue, jaws, teeth, and gums to look for any oral health problems, such as:  Cavities (dental caries). This is decay on the tooth caused by plaque, sugar, and acid in the mouth. It is best to catch a cavity when it is small.  Inflammation of the gums caused by plaque buildup (gingivitis).  Problems with the mouth or malformed  or misaligned teeth.  Oral cancer or other diseases of the soft tissues or jaws. KEEP YOUR TEETH AND GUMS HEALTHY For healthy teeth and gums, follow these general guidelines as well as your dentist's specific advice:  Have your teeth professionally cleaned at the dentist every 6 months.  Brush twice daily with a fluoride toothpaste.  Floss your teeth daily.  Ask your dentist if you need fluoride supplements, treatments, or fluoride toothpaste.  Eat a healthy diet. Reduce foods and drinks with added sugar.  Avoid smoking. TREATMENT FOR ORAL HEALTH PROBLEMS If you have oral health problems, treatment varies depending on the conditions present in your teeth and gums.  Your caregiver will most likely recommend good oral hygiene at each visit.  For cavities, gingivitis, or other oral health disease, your caregiver will perform a procedure to treat the problem. This is typically done at a separate appointment. Sometimes your caregiver will refer you to another dental specialist for specific tooth problems or for surgery. SEEK IMMEDIATE DENTAL CARE IF:  You have pain, bleeding, or soreness in the gum, tooth, jaw, or mouth area.  A permanent tooth becomes loose or separated from the gum socket.  You experience a blow or injury to the mouth or jaw area. Document Released: 04/30/2011 Document Revised: 11/10/2011 Document Reviewed: 04/30/2011 [facility] LLCExitCare Patient Information 2015 [place] RidgeExitCare, [place]. This information is not intended to replace advice given to you by your health care provider. Make sure you discuss any questions you have with your health care provider.   Please follow-up with dentistry for definitive care  of your tooth pain. You were given an oral nerve block in the ED which relieved some of your pain. Please take all of your antibiotic as prescribed. He may take your pain medicine as directed for breakthrough pain. Return to ED for worsening symptoms

## 2014-08-14 NOTE — ED Notes (Signed)
Patient states he took ibuprofen at 1600

## 2014-08-14 NOTE — ED Provider Notes (Signed)
CSN: [id number]     Arrival date & time 08/14/14  1637 History  This chart was scribed for non-physician practitioner, Joycie PeekBenjamin Ken Bonn, PA-C working with Purvis SheffieldForrest Harrison, MD by Gwenyth Oberatherine Macek, ED scribe. This patient was seen in room TR06C/TR06C and the patient's care was started at 7:05 PM   Chief Complaint  Patient presents with  . Dental Pain   The history is provided by the patient. No language interpreter was used.   HPI Comments: Jake Smith is a 43 y.o. male who presents to the Emergency Department complaining of intermittent, gradually worsening left upper, posterior dental pain that started 2 weeks ago. He thinks that one of his fillings fell out, but notes that he has not followed up with a dentist for his symptoms because he does not have insurance. Pt has tried Ibuprofen and Orajel with no relief. He reports that pain becomes worse with palpation or closing his teeth together. He denies SOB, CP and decreased appetite as associated symptoms.   History reviewed. No pertinent past medical history. History reviewed. No pertinent past surgical history. History reviewed. No pertinent family history. History  Substance Use Topics  . Smoking status: Current Every Day Smoker    Types: Cigarettes  . Smokeless tobacco: Not on file  . Alcohol Use: Yes    Review of Systems  Constitutional: Negative for appetite change.  HENT: Positive for dental problem.   Respiratory: Negative for shortness of breath.   Cardiovascular: Negative for chest pain.  All other systems reviewed and are negative.   Allergies  Shellfish allergy and Tylenol  Home Medications   Prior to Admission medications   Medication Sig Start Date End Date Taking? Authorizing Provider  HYDROcodone-acetaminophen (NORCO/VICODIN) 5-325 MG per tablet Take 2 tablets by mouth every 4 (four) hours as needed for moderate pain or severe pain. 08/14/14   Earle GellBenjamin W Earlie Arciga, PA-C  ibuprofen (ADVIL,MOTRIN) 200 MG  tablet Take 400 mg by mouth every 6 (six) hours as needed. For pain.    Historical Provider, MD  ondansetron (ZOFRAN) 4 MG tablet Take 1 tablet (4 mg total) by mouth every 6 (six) hours. 08/14/14   Earle GellBenjamin W Zakhari Fogel, PA-C  penicillin v potassium (VEETID) 500 MG tablet Take 1 tablet (500 mg total) by mouth 4 (four) times daily. 08/14/14 08/21/14  Earle GellBenjamin W Rozelia Catapano, PA-C   BP 163/94 mmHg  Pulse 86  Temp(Src) 98.9 F (37.2 C) (Oral)  Resp 18  Ht 6\' 3"  (1.905 m)  Wt 194 lb (87.998 kg)  BMI 24.25 kg/m2  SpO2 97% Physical Exam  Constitutional: He appears well-developed and well-nourished. No distress.  HENT:  Head: Normocephalic and atraumatic.  Mouth/Throat: Oropharynx is clear and moist. No oropharyngeal exudate.  Oropharynx moist without erythema; no cervical lymphadenopathy; no trismus; no glossal elevation; no unilateral tonsillar enlargement; uvula midline; tolerating secretions well; patent airway; overall poor dentition; multiple caries; multiple missing teeth; tooth pain localized to tooth #3; significant decay to tooth and surrounding teeth; will require dentistry  Eyes: Conjunctivae and EOM are normal.  Neck: Neck supple. No tracheal deviation present.  Cardiovascular: Normal rate.   Pulmonary/Chest: Effort normal. No respiratory distress.  Abdominal: Soft. There is no tenderness.  Lymphadenopathy:    He has no cervical adenopathy.  Skin: Skin is warm and dry.  Psychiatric: He has a normal mood and affect. His behavior is normal.  Nursing note and vitals reviewed.   ED Course  NERVE BLOCK Date/Time: 08/14/2014 8:05 PM Performed by: Sharlene MottsARTNER, Amela Handley W  Authorized by: Sharlene MottsARTNER, Izaac Reisig W Consent: Verbal consent obtained. Risks and benefits: risks, benefits and alternatives were discussed Consent given by: patient Patient understanding: patient states understanding of the procedure being performed Site marked: the operative site was marked Patient identity confirmed:  verbally with patient Time out: Immediately prior to procedure a "time out" was called to verify the correct patient, procedure, equipment, support staff and site/side marked as required. Indications: pain relief Body area: face/mouth Nerve: posterior superior alveolar Laterality: right Patient sedated: no Patient position: sitting Needle gauge: 27 G Location technique: anatomical landmarks Local anesthetic: bupivacaine 0.5% without epinephrine Anesthetic total: 4 ml Outcome: pain improved Patient tolerance: Patient tolerated the procedure well with no immediate complications   (including critical care time) DIAGNOSTIC STUDIES: Oxygen Saturation is 99% on RA, normal by my interpretation.    COORDINATION OF CARE: 7:11 PM Discussed treatment plan with pt and pt agreed to plan.  Labs Review Labs Reviewed - No data to display  Imaging Review No results found.   EKG Interpretation None     Meds given in ED:  Medications  fentaNYL (SUBLIMAZE) injection 50 mcg (50 mcg Nasal Given 08/14/14 1814)  bupivacaine (MARCAINE) 0.5 % injection 5 mL (5 mLs Infiltration Given 08/14/14 2004)    Discharge Medication List as of 08/14/2014  8:28 PM    START taking these medications   Details  HYDROcodone-acetaminophen (NORCO/VICODIN) 5-325 MG per tablet Take 2 tablets by mouth every 4 (four) hours as needed for moderate pain or severe pain., Starting 08/14/2014, Until Discontinued, Print    ondansetron (ZOFRAN) 4 MG tablet Take 1 tablet (4 mg total) by mouth every 6 (six) hours., Starting 08/14/2014, Until Discontinued, Print    penicillin v potassium (VEETID) 500 MG tablet Take 1 tablet (500 mg total) by mouth 4 (four) times daily., Starting 08/14/2014, Until Mon 08/21/14, Print       Filed Vitals:   08/14/14 1639 08/14/14 2023  BP: 178/94 163/94  Pulse: 117 86  Temp: 99.1 F (37.3 C) 98.9 F (37.2 C)  TempSrc: Oral Oral  Resp: 18 18  Height: 6\' 3"  (1.905 m)   Weight: 194 lb  (87.998 kg)   SpO2: 99% 97%    MDM  Vitals stable - WNL -afebrile, tachycardia improved with administration of nerve block Pt resting comfortably in ED. patient reports a nerve block worked well PE--not concerning for other acute or emergent pathology. There is no trismus, glossal elevation, submandibular pain, unilateral tonsillar swelling. Low concern for Ludwig angina Vincent angina, peritonsillar abscess or other acute oral infection. Will DC with antibiotics and pain medicine as well as referral to dentistry. Discussed f/u with PCP and return precautions, pt very amenable to plan. Stable, in good condition and is appropriate for discharge   Final diagnoses:  Tooth pain    I personally performed the services described in this documentation, which was scribed in my presence. The recorded information has been reviewed and is accurate.    Earle GellBenjamin W [place] Village[place]ner, PA-C 08/15/14 1437  Purvis SheffieldForrest Harrison, MD 08/15/14 [phone]

## 2014-08-14 NOTE — ED Notes (Signed)
Pharmacy called to have the Marcaine sent to fast track

## 2014-08-14 NOTE — ED Notes (Signed)
Pt states upper R dental pain. Ongoing x1 week. 10/10 pain at the time. Pt is alert and oriented x4. NAD.

## 2014-08-14 NOTE — ED Notes (Signed)
Discharge instructions and prescriptions reviewed.  Voiced understanding.  

## 2015-11-15 ENCOUNTER — Encounter (HOSPITAL_COMMUNITY): Payer: Self-pay

## 2015-11-15 DIAGNOSIS — Y9289 Other specified places as the place of occurrence of the external cause: Secondary | ICD-10-CM | POA: Insufficient documentation

## 2015-11-15 DIAGNOSIS — Y999 Unspecified external cause status: Secondary | ICD-10-CM | POA: Insufficient documentation

## 2015-11-15 DIAGNOSIS — S76211A Strain of adductor muscle, fascia and tendon of right thigh, initial encounter: Secondary | ICD-10-CM | POA: Insufficient documentation

## 2015-11-15 DIAGNOSIS — X58XXXA Exposure to other specified factors, initial encounter: Secondary | ICD-10-CM | POA: Insufficient documentation

## 2015-11-15 DIAGNOSIS — F17210 Nicotine dependence, cigarettes, uncomplicated: Secondary | ICD-10-CM | POA: Insufficient documentation

## 2015-11-15 DIAGNOSIS — Z79899 Other long term (current) drug therapy: Secondary | ICD-10-CM | POA: Insufficient documentation

## 2015-11-15 DIAGNOSIS — Y9389 Activity, other specified: Secondary | ICD-10-CM | POA: Insufficient documentation

## 2015-11-15 LAB — CBC WITH DIFFERENTIAL/PLATELET
BASOS ABS: 0 10*3/uL (ref 0.0–0.1)
BASOS PCT: 0 %
Eosinophils Absolute: 0.6 10*3/uL (ref 0.0–0.7)
Eosinophils Relative: 5 %
HCT: 40.9 % (ref 39.0–52.0)
Hemoglobin: 13.8 g/dL (ref 13.0–17.0)
Lymphocytes Relative: 19 %
Lymphs Abs: 2.4 10*3/uL (ref 0.7–4.0)
MCH: 30.3 pg (ref 26.0–34.0)
MCHC: 33.7 g/dL (ref 30.0–36.0)
MCV: 89.9 fL (ref 78.0–100.0)
Monocytes Absolute: 1.7 10*3/uL — ABNORMAL HIGH (ref 0.1–1.0)
Monocytes Relative: 14 %
NEUTROS ABS: 7.8 10*3/uL — AB (ref 1.7–7.7)
NEUTROS PCT: 62 %
PLATELETS: 243 10*3/uL (ref 150–400)
RBC: 4.55 MIL/uL (ref 4.22–5.81)
RDW: 16.2 % — AB (ref 11.5–15.5)
WBC: 12.5 10*3/uL — AB (ref 4.0–10.5)

## 2015-11-15 LAB — COMPREHENSIVE METABOLIC PANEL
ALBUMIN: 4 g/dL (ref 3.5–5.0)
ALT: 19 U/L (ref 17–63)
AST: 19 U/L (ref 15–41)
Alkaline Phosphatase: 46 U/L (ref 38–126)
Anion gap: 12 (ref 5–15)
BUN: 15 mg/dL (ref 6–20)
CHLORIDE: 107 mmol/L (ref 101–111)
CO2: 23 mmol/L (ref 22–32)
Calcium: 9.3 mg/dL (ref 8.9–10.3)
Creatinine, Ser: 1.25 mg/dL — ABNORMAL HIGH (ref 0.61–1.24)
GFR calc Af Amer: >60 mL/min (ref >60)
GFR calc non Af Amer: >60 mL/min (ref >60)
GLUCOSE: 88 mg/dL (ref 65–99)
POTASSIUM: 4 mmol/L (ref 3.5–5.1)
SODIUM: 142 mmol/L (ref 135–145)
Total Bilirubin: 0.4 mg/dL (ref 0.3–1.2)
Total Protein: 6.5 g/dL (ref 6.5–8.1)

## 2015-11-15 LAB — URINALYSIS, ROUTINE W REFLEX MICROSCOPIC
Bilirubin Urine: NEGATIVE
Glucose, UA: NEGATIVE mg/dL
HGB URINE DIPSTICK: NEGATIVE
Ketones, ur: NEGATIVE mg/dL
Leukocytes, UA: NEGATIVE
Nitrite: NEGATIVE
Protein, ur: NEGATIVE mg/dL
SPECIFIC GRAVITY, URINE: 1.026 (ref 1.005–1.030)
pH: 5 (ref 5.0–8.0)

## 2015-11-15 NOTE — ED Notes (Signed)
Pt here with R sided groin pain onset 2 days ago. Denies abnormal urinary symptoms and denies having abscess to area. Denies swelling to penis or testicles.

## 2015-11-16 ENCOUNTER — Emergency Department (HOSPITAL_COMMUNITY)
Admission: EM | Admit: 2015-11-16 | Discharge: 2015-11-16 | Disposition: A | Discharge status: Home / Self Care | Payer: Self-pay | Attending: Emergency Medicine | Admitting: Emergency Medicine

## 2015-11-16 DIAGNOSIS — S76219A Strain of adductor muscle, fascia and tendon of unspecified thigh, initial encounter: Secondary | ICD-10-CM

## 2015-11-16 MED ORDER — KETOROLAC TROMETHAMINE 60 MG/2ML IM SOLN
60.0000 mg | Freq: Once | INTRAMUSCULAR | Status: AC
  Administered 2015-11-16: 60 mg via INTRAMUSCULAR
  Filled 2015-11-16: qty 2

## 2015-11-16 MED ORDER — CYCLOBENZAPRINE HCL 10 MG PO TABS: 10.0000 mg | ORAL_TABLET | Freq: Two times a day (BID) | ORAL | Status: DC | PRN

## 2015-11-16 MED ORDER — NAPROXEN 500 MG PO TABS: 500.0000 mg | ORAL_TABLET | Freq: Two times a day (BID) | ORAL | Status: DC

## 2015-11-16 NOTE — ED Notes (Signed)
Crutches provided.

## 2015-11-16 NOTE — ED Provider Notes (Signed)
CSN: [id number]     Arrival date & time 11/15/15  2124 History   First MD Initiated Contact with Patient 11/16/15 0524     Chief Complaint  Patient presents with  . Groin Pain     (Consider location/radiation/quality/duration/timing/severity/associated sxs/prior Treatment) Patient is a 45 y.o. male presenting with groin pain.  Groin Pain This is a new problem. The current episode started 2 days ago. The problem occurs constantly. The problem has not changed since onset.Pertinent negatives include no chest pain, no abdominal pain, no headaches and no shortness of breath. Exacerbated by: abduction, standing, sitting, movements.    History reviewed. No pertinent past medical history. History reviewed. No pertinent past surgical history. No family history on file. Social History  Substance Use Topics  . Smoking status: Current Every Day Smoker    Types: Cigarettes  . Smokeless tobacco: None  . Alcohol Use: Yes    Review of Systems  Constitutional: Negative for fever.  HENT: Negative for sore throat.   Eyes: Negative for visual disturbance.  Respiratory: Negative for shortness of breath.   Cardiovascular: Negative for chest pain.  Gastrointestinal: Negative for nausea, vomiting, abdominal pain, diarrhea and constipation.  Genitourinary: Negative for difficulty urinating.  Musculoskeletal: Positive for myalgias. Negative for back pain and neck stiffness.  Skin: Negative for rash.  Neurological: Negative for syncope and headaches.      Allergies  Shellfish allergy and Tylenol  Home Medications   Prior to Admission medications   Medication Sig Start Date End Date Taking? Authorizing Provider  ibuprofen (ADVIL,MOTRIN) 200 MG tablet Take 400 mg by mouth every 6 (six) hours as needed. For pain.   Yes Historical Provider, MD  cyclobenzaprine (FLEXERIL) 10 MG tablet Take 1 tablet (10 mg total) by mouth 2 (two) times daily as needed for muscle spasms. 11/16/15   Alvira MondayErin Hayat [place], MD   naproxen (NAPROSYN) 500 MG tablet Take 1 tablet (500 mg total) by mouth 2 (two) times daily with a meal. 11/16/15   Alvira MondayErin Chalmer Zheng, MD   BP 131/94 mmHg  Pulse 72  Temp(Src) 99 F (37.2 C) (Oral)  Resp 18  SpO2 100% Physical Exam  Constitutional: He is oriented to person, place, and time. He appears well-developed and well-nourished. No distress.  HENT:  Head: Normocephalic and atraumatic.  Eyes: Conjunctivae and EOM are normal.  Neck: Normal range of motion.  Cardiovascular: Normal rate, regular rhythm and intact distal pulses.   Pulmonary/Chest: Effort normal. No respiratory distress.  Abdominal: Soft. He exhibits no distension. There is no tenderness. There is no guarding.  Musculoskeletal: He exhibits no edema.       Right hip: He exhibits tenderness (right groin inferior to inguinal area, medial leg). He exhibits normal range of motion and normal strength.       Right knee: He exhibits normal range of motion.  Neurological: He is alert and oriented to person, place, and time.  Skin: Skin is warm and dry. He is not diaphoretic.  Nursing note and vitals reviewed.   ED Course  Procedures (including critical care time) Labs Review Labs Reviewed  CBC WITH DIFFERENTIAL/PLATELET - Abnormal; Notable for the following:    WBC 12.5 (*)    RDW 16.2 (*)    Neutro Abs 7.8 (*)    Monocytes Absolute 1.7 (*)    All other components within normal limits  COMPREHENSIVE METABOLIC PANEL - Abnormal; Notable for the following:    Creatinine, Ser 1.25 (*)    All other components within normal  limits  URINALYSIS, ROUTINE W REFLEX MICROSCOPIC (NOT AT [facility])    Imaging Review No results found. I have personally reviewed and evaluated these images and lab results as part of my medical decision-making.   EKG Interpretation None      MDM   Final diagnoses:  Groin strain, initial encounter   45yo male with no significant medical history presents with right groin pain. No testicular  pain, swelling, and low suspicion for torsion by history and exam. No abd pain or tenderness.  No trauma to suggest fracture.  Pt with primarily right adductor muscle spasm and pain consistent with groin strain.  Recommend, rest, ice/heat, weightbearing as tolerated.  Given toradol and rx for naproxen and flexeril. Patient discharged in stable condition with understanding of reasons to return.    Alvira Monday, MD 11/16/15 [phone]

## 2015-11-16 NOTE — ED Notes (Signed)
Notified dr. Lona Millardschlossmsan that patient is becoming inpatient. Md acknowledges.

## 2018-02-24 ENCOUNTER — Emergency Department (HOSPITAL_BASED_OUTPATIENT_CLINIC_OR_DEPARTMENT_OTHER): Payer: Self-pay

## 2018-02-24 ENCOUNTER — Other Ambulatory Visit: Payer: Self-pay

## 2018-02-24 ENCOUNTER — Emergency Department (HOSPITAL_COMMUNITY)
Admission: EM | Admit: 2018-02-24 | Discharge: 2018-02-24 | Disposition: A | Discharge status: Home / Self Care | Payer: Self-pay | Attending: Emergency Medicine | Admitting: Emergency Medicine

## 2018-02-24 ENCOUNTER — Emergency Department (HOSPITAL_COMMUNITY): Payer: Self-pay

## 2018-02-24 ENCOUNTER — Encounter (HOSPITAL_COMMUNITY): Payer: Self-pay

## 2018-02-24 DIAGNOSIS — M79605 Pain in left leg: Secondary | ICD-10-CM

## 2018-02-24 DIAGNOSIS — Z79899 Other long term (current) drug therapy: Secondary | ICD-10-CM | POA: Insufficient documentation

## 2018-02-24 DIAGNOSIS — F17210 Nicotine dependence, cigarettes, uncomplicated: Secondary | ICD-10-CM | POA: Insufficient documentation

## 2018-02-24 DIAGNOSIS — M79609 Pain in unspecified limb: Secondary | ICD-10-CM

## 2018-02-24 DIAGNOSIS — M79662 Pain in left lower leg: Secondary | ICD-10-CM | POA: Insufficient documentation

## 2018-02-24 LAB — CBC WITH DIFFERENTIAL/PLATELET
Abs Immature Granulocytes: 0 10*3/uL (ref 0.0–0.1)
BASOS PCT: 0 %
Basophils Absolute: 0 10*3/uL (ref 0.0–0.1)
Eosinophils Absolute: 0.2 10*3/uL (ref 0.0–0.7)
Eosinophils Relative: 2 %
HCT: 43.3 % (ref 39.0–52.0)
HEMOGLOBIN: 13.7 g/dL (ref 13.0–17.0)
Immature Granulocytes: 0 %
LYMPHS PCT: 11 %
Lymphs Abs: 1.3 10*3/uL (ref 0.7–4.0)
MCH: 29 pg (ref 26.0–34.0)
MCHC: 31.6 g/dL (ref 30.0–36.0)
MCV: 91.7 fL (ref 78.0–100.0)
MONO ABS: 1.5 10*3/uL — AB (ref 0.1–1.0)
MONOS PCT: 13 %
NEUTROS ABS: 8.2 10*3/uL — AB (ref 1.7–7.7)
Neutrophils Relative %: 74 %
Platelets: 231 10*3/uL (ref 150–400)
RBC: 4.72 MIL/uL (ref 4.22–5.81)
RDW: 14.8 % (ref 11.5–15.5)
WBC: 11.2 10*3/uL — ABNORMAL HIGH (ref 4.0–10.5)

## 2018-02-24 LAB — COMPREHENSIVE METABOLIC PANEL
ALBUMIN: 3.8 g/dL (ref 3.5–5.0)
ALK PHOS: 44 U/L (ref 38–126)
ALT: 20 U/L (ref 0–44)
ANION GAP: 7 (ref 5–15)
AST: 20 U/L (ref 15–41)
BUN: 8 mg/dL (ref 6–20)
CALCIUM: 9.3 mg/dL (ref 8.9–10.3)
CO2: 25 mmol/L (ref 22–32)
Chloride: 108 mmol/L (ref 98–111)
Creatinine, Ser: 1.08 mg/dL (ref 0.61–1.24)
GFR calc Af Amer: >60 mL/min (ref >60)
GFR calc non Af Amer: >60 mL/min (ref >60)
GLUCOSE: 107 mg/dL — AB (ref 70–99)
Potassium: 4.4 mmol/L (ref 3.5–5.1)
SODIUM: 140 mmol/L (ref 135–145)
Total Bilirubin: 0.9 mg/dL (ref 0.3–1.2)
Total Protein: 7.1 g/dL (ref 6.5–8.1)

## 2018-02-24 MED ORDER — METHOCARBAMOL 500 MG PO TABS: 500.0000 mg | ORAL_TABLET | Freq: Two times a day (BID) | ORAL | 0 refills | Status: DC

## 2018-02-24 MED ORDER — LIDOCAINE 5 % EX PTCH: 1.0000 | MEDICATED_PATCH | CUTANEOUS | 0 refills | Status: DC

## 2018-02-24 NOTE — ED Triage Notes (Signed)
Pt reports leg swelling above left knee and behind left knee states he is concerned that he "was bitten bu a spider or something" in his sleep.

## 2018-02-24 NOTE — ED Notes (Signed)
Patient transported to X-ray 

## 2018-02-24 NOTE — ED Provider Notes (Signed)
[facility] EMERGENCY DEPARTMENT Provider Note   CSN: [id number] Arrival date & time: 02/24/18  1200     History   Chief Complaint Chief Complaint  Patient presents with  . Leg Swelling    HPI Jake Smith is a 47 y.o. male.  HPI   Jake Smith is a 47 y.o. male, patient with no pertinent past medical history, presenting to the ED with pain and swelling to the posterior left leg, just proximal to the knee joint, beginning upon waking yesterday morning.  Pain is severe, aching/throbbing, radiating proximally.  He has not had similar pain before.  Denies fever/chills, shortness of breath, chest pain, numbness, weakness, falls/injuries, or any other complaints.      History reviewed. No pertinent past medical history.  There are no active problems to display for this patient.   History reviewed. No pertinent surgical history.      Home Medications    Prior to Admission medications   Medication Sig Start Date End Date Taking? Authorizing Provider  cyclobenzaprine (FLEXERIL) 10 MG tablet Take 1 tablet (10 mg total) by mouth 2 (two) times daily as needed for muscle spasms. 11/16/15   Alvira Monday, MD  ibuprofen (ADVIL,MOTRIN) 200 MG tablet Take 400 mg by mouth every 6 (six) hours as needed. For pain.    [provider]  lidocaine (LIDODERM) 5 % Place 1 patch onto the skin daily. Remove & Discard patch within 12 hours or as directed by MD 02/24/18   Joy, Shawn C, PA-C  methocarbamol (ROBAXIN) 500 MG tablet Take 1 tablet (500 mg total) by mouth 2 (two) times daily. 02/24/18   Joy, Shawn C, PA-C  naproxen (NAPROSYN) 500 MG tablet Take 1 tablet (500 mg total) by mouth 2 (two) times daily with a meal. 11/16/15   Alvira Monday, MD    Family History No family history on file.  Social History Social History   Tobacco Use  . Smoking status: Current Every Day Smoker    Types: Cigarettes, Cigars  . Smokeless tobacco: Never Used    Substance Use Topics  . Alcohol use: Yes    Comment: social   . Drug use: Yes    Types: Marijuana     Allergies   Shellfish allergy and Tylenol [acetaminophen]   Review of Systems Review of Systems  Constitutional: Negative for chills and fever.  Respiratory: Negative for shortness of breath.   Cardiovascular: Positive for leg swelling. Negative for chest pain.  Gastrointestinal: Negative for nausea and vomiting.  Musculoskeletal: Negative for back pain.  Neurological: Negative for weakness and numbness.  All other systems reviewed and are negative.    Physical Exam Updated Vital Signs BP (!) 150/95 (BP Location: Right Arm)   Pulse 84   Temp 99.8 F (37.7 C) (Oral)   Resp 18   Ht 6\' 3"  (1.905 m)   Wt 88.5 kg (195 lb)   SpO2 98%   BMI 24.37 kg/m   Physical Exam  Constitutional: He appears well-developed and well-nourished. No distress.  HENT:  Head: Normocephalic and atraumatic.  Eyes: Conjunctivae are normal.  Neck: Neck supple.  Cardiovascular: Normal rate, regular rhythm, normal heart sounds and intact distal pulses.  Pulmonary/Chest: Effort normal and breath sounds normal. No respiratory distress.  No increased work of breathing.  Patient speaks in full sentences without difficulty.  Abdominal: Soft. There is no tenderness. There is no guarding.  Musculoskeletal: He exhibits edema and tenderness. He exhibits no deformity.  Legs: Tenderness with mild swelling in the area indicated in the left leg.  Full range of motion, though painful.  No tenderness, swelling, erythema, or increased warmth to the calf or upper thigh.  Lymphadenopathy:    He has no cervical adenopathy.  Neurological: He is alert.  Sensation grossly intact to the lower extremities bilaterally. Strength 5/5 with flexion and extension at the bilateral hips, knees, and ankles. Limping, antalgic gait. Coordination intact with heel to shin testing.  Skin: Skin is warm and dry. He is not  diaphoretic.  Psychiatric: He has a normal mood and affect. His behavior is normal.  Nursing note and vitals reviewed.    ED Treatments / Results  Labs (all labs ordered are listed, but only abnormal results are displayed) Labs Reviewed  COMPREHENSIVE METABOLIC PANEL - Abnormal; Notable for the following components:      Result Value   Glucose, Bld 107 (*)    All other components within normal limits  CBC WITH DIFFERENTIAL/PLATELET - Abnormal; Notable for the following components:   WBC 11.2 (*)    Neutro Abs 8.2 (*)    Monocytes Absolute 1.5 (*)    All other components within normal limits    EKG None  Radiology Dg Knee Complete 4 Views Left  Result Date: 02/24/2018 CLINICAL DATA:  Acute left knee pain. EXAM: LEFT KNEE - COMPLETE 4+ VIEW COMPARISON:  None. FINDINGS: No evidence of fracture, dislocation, or joint effusion. No evidence of arthropathy or other focal bone abnormality. Soft tissues are unremarkable. IMPRESSION: Normal left knee. Electronically Signed   By: Lupita RaiderJames  Green Jr, M.D.   On: 02/24/2018 13:06   Vascular Ultrasound Left Lower Venous Study has been completed.  Preliminary findings: No evidence of a left deep vein thrombosis. No evidence of a left bakers cyst.  Leta JunglingCooper, Tiffany M 02/24/2018, 1:38 PM   Procedures Procedures (including critical care time)  Medications Ordered in ED Medications - No data to display   Initial Impression / Assessment and Plan / ED Course  I have reviewed the triage vital signs and the nursing notes.  Pertinent labs & imaging results that were available during my care of the patient were reviewed by me and considered in my medical decision making (see chart for details).      Patient presents with 2 days of left leg pain.  No neurologic deficits.  No area of fluctuance or area consistent with cellulitis on today's exam.  Duplex ultrasound negative.  X-ray negative.  Orthopedic follow-up. The patient was given instructions  for home care as well as return precautions. Patient voices understanding of these instructions, accepts the plan, and is comfortable with discharge.    Final Clinical Impressions(s) / ED Diagnoses   Final diagnoses:  Pain of left lower extremity    ED Discharge Orders        Ordered    methocarbamol (ROBAXIN) 500 MG tablet  2 times daily     02/24/18 1349    lidocaine (LIDODERM) 5 %  Every 24 hours     02/24/18 1349       Anselm PancoastJoy, Shawn C, PA-C 02/24/18 1720    Mancel BaleWentz, Elliott, MD 02/25/18 [phone]

## 2018-02-24 NOTE — ED Notes (Signed)
Patient transported to Ultrasound 

## 2018-02-24 NOTE — Progress Notes (Signed)
*  PRELIMINARY RESULTS* Vascular Ultrasound Left Lower Venous Study has been completed.  Preliminary findings: No evidence of a left deep vein thrombosis. No evidence of a left bakers cyst.  Leta JunglingCooper, Nidhi Jacome M 02/24/2018, 1:38 PM

## 2018-02-24 NOTE — ED Notes (Signed)
Patient given ginger ale. 

## 2018-02-24 NOTE — Discharge Instructions (Signed)
Take it easy, but do not lay around too much as this may make any stiffness worse.  Antiinflammatory medications: Take 600 mg of ibuprofen every 6 hours or 440 mg (over the counter dose) to 500 mg (prescription dose) of naproxen every 12 hours for the next 3 days. After this time, these medications may be used as needed for pain. Take these medications with food to avoid upset stomach. Choose only one of these medications, do not take them together.  Tylenol: Should you continue to have additional pain while taking the ibuprofen or naproxen, you may add in tylenol as needed. Your daily total maximum amount of tylenol from all sources should be limited to 4000mg /day for persons without liver problems, or 2000mg /day for those with liver problems. Muscle relaxer: Robaxin is a muscle relaxer and may help loosen stiff muscles. Do not take the Robaxin while driving or performing other dangerous activities.  Lidocaine patches: These are available via either prescription or over-the-counter. The over-the-counter option may be more economical one and are likely just as effective. There are multiple over-the-counter brands, such as Salonpas. Exercises: Be sure to perform the attached exercises starting with three times a week and working up to performing them daily. This is an essential part of preventing long term problems.   Follow up with a primary care provider or orthopedic specialist for any future management of these complaints.

## 2018-02-24 NOTE — ED Notes (Signed)
Pt back from Ultrasound

## 2018-08-12 IMAGING — DX DG KNEE COMPLETE 4+V*L*
4 series · 4 of 4 positions shown · non-contrast
Comparison: None.

CLINICAL DATA: Acute left knee pain.

EXAM:
LEFT KNEE - COMPLETE 4+ VIEW

[knee ap]
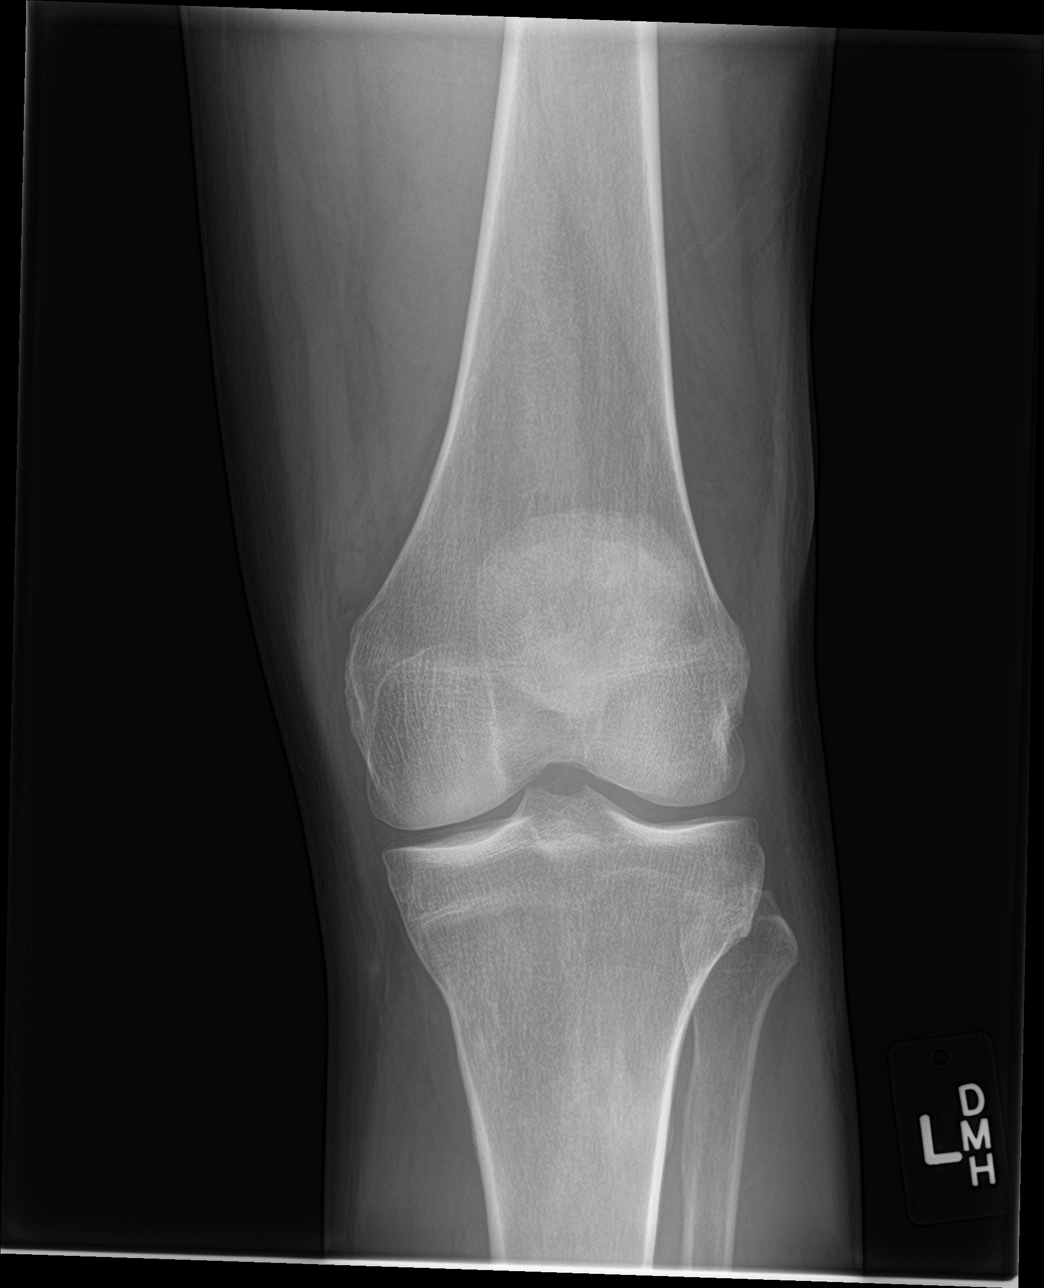

[knee lat]
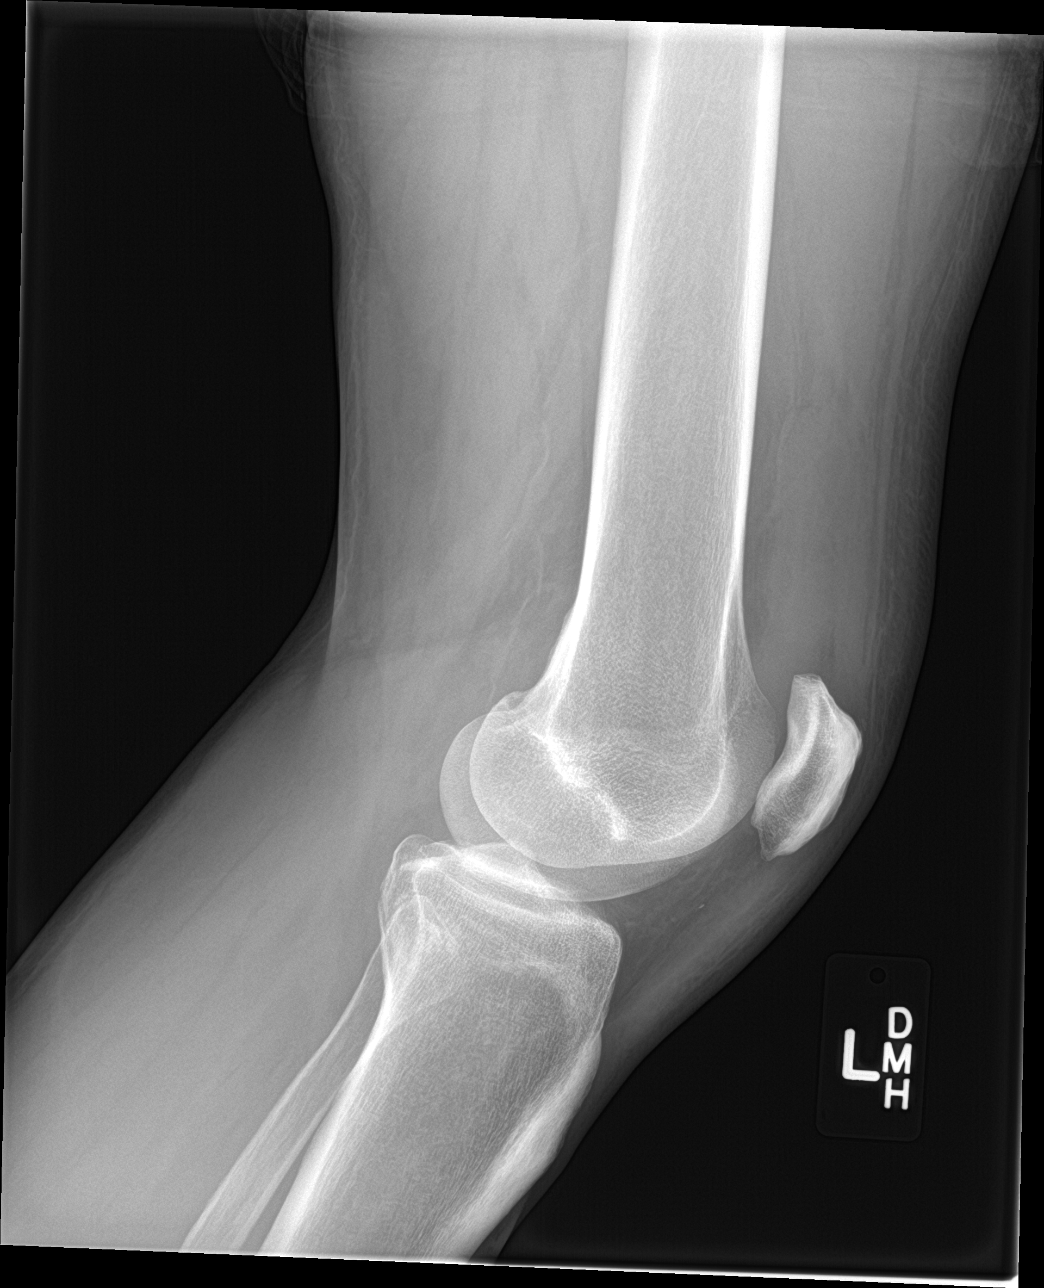

[knee obl (1 of 2)]
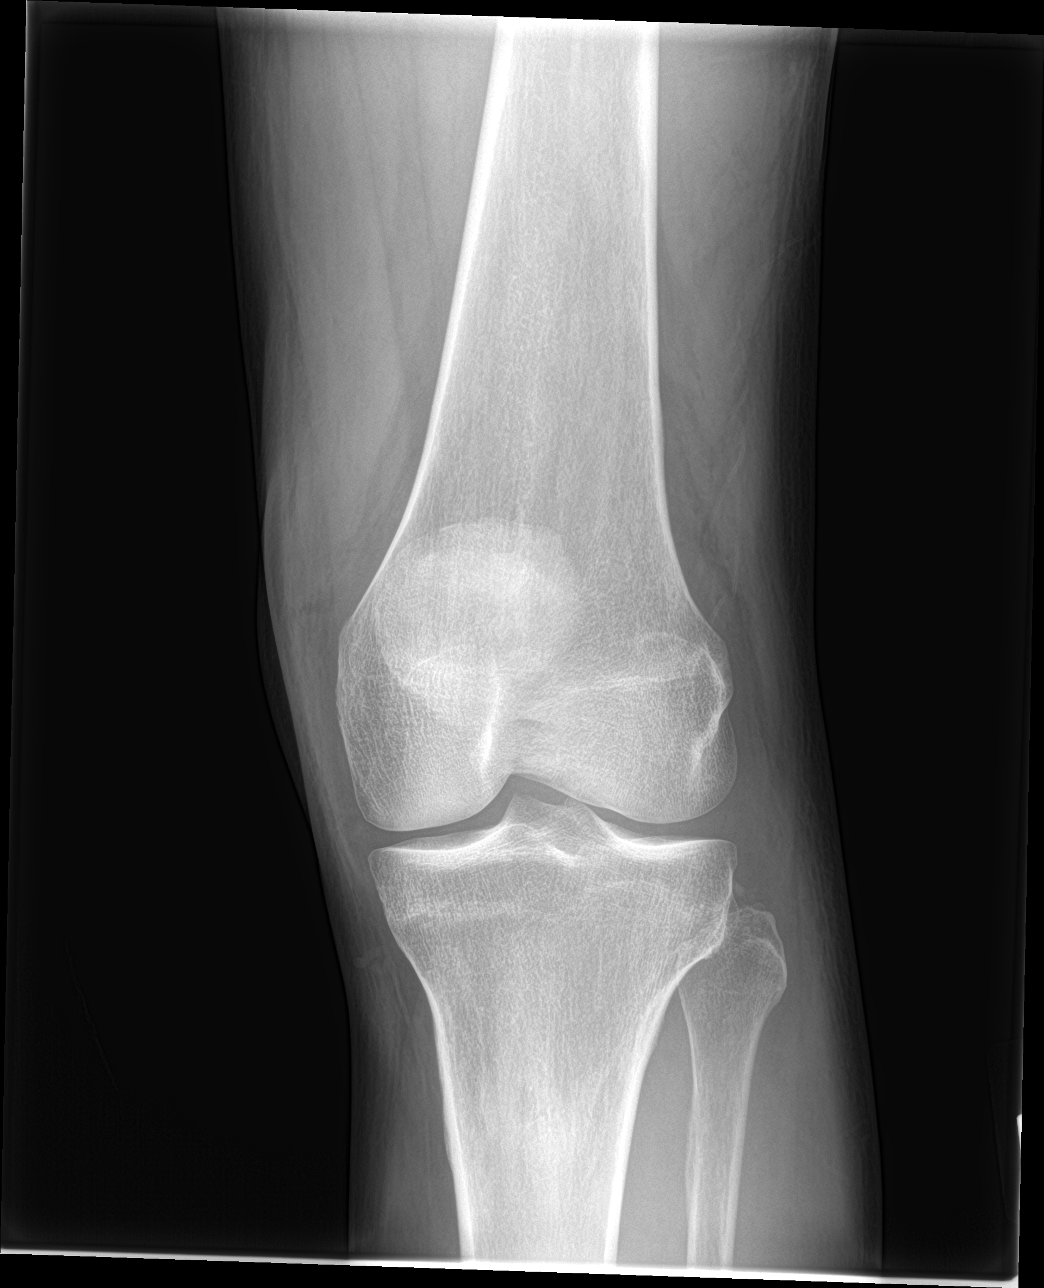

[knee obl (2 of 2)]
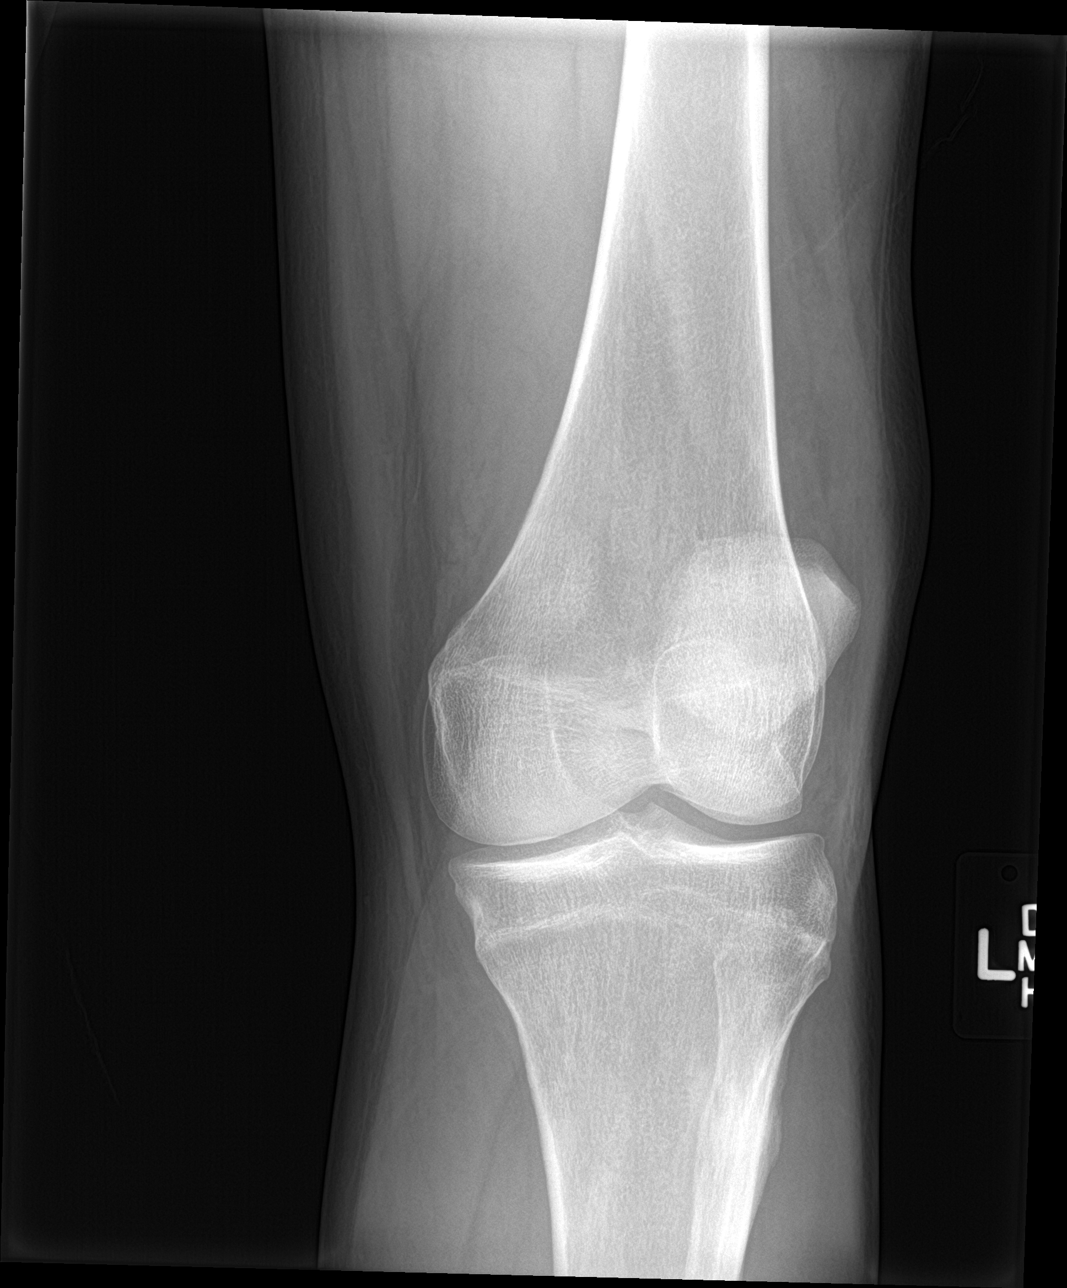

[4 of 4 positions shown; findings below may reference images not displayed]

FINDINGS: No evidence of fracture, dislocation, or joint effusion. No evidence
of arthropathy or other focal bone abnormality. Soft tissues are
unremarkable.
IMPRESSION: Normal left knee.

## 2019-08-08 ENCOUNTER — Emergency Department (HOSPITAL_COMMUNITY)
Admission: EM | Admit: 2019-08-08 | Discharge: 2019-08-08 | Disposition: A | Discharge status: Home / Self Care | Payer: Self-pay | Attending: Emergency Medicine | Admitting: Emergency Medicine

## 2019-08-08 ENCOUNTER — Encounter (HOSPITAL_COMMUNITY): Payer: Self-pay | Admitting: Emergency Medicine

## 2019-08-08 ENCOUNTER — Emergency Department (HOSPITAL_COMMUNITY): Payer: Self-pay

## 2019-08-08 ENCOUNTER — Other Ambulatory Visit: Payer: Self-pay

## 2019-08-08 DIAGNOSIS — Y93F2 Activity, caregiving, lifting: Secondary | ICD-10-CM | POA: Insufficient documentation

## 2019-08-08 DIAGNOSIS — M5442 Lumbago with sciatica, left side: Secondary | ICD-10-CM | POA: Insufficient documentation

## 2019-08-08 DIAGNOSIS — G8929 Other chronic pain: Secondary | ICD-10-CM | POA: Insufficient documentation

## 2019-08-08 DIAGNOSIS — X500XXA Overexertion from strenuous movement or load, initial encounter: Secondary | ICD-10-CM | POA: Insufficient documentation

## 2019-08-08 DIAGNOSIS — I700 Atherosclerosis of aorta: Secondary | ICD-10-CM | POA: Insufficient documentation

## 2019-08-08 DIAGNOSIS — F17210 Nicotine dependence, cigarettes, uncomplicated: Secondary | ICD-10-CM | POA: Insufficient documentation

## 2019-08-08 MED ORDER — PREDNISONE 20 MG PO TABS: ORAL_TABLET | ORAL | 0 refills | Status: DC

## 2019-08-08 MED ORDER — METHOCARBAMOL 500 MG PO TABS: 1000.0000 mg | ORAL_TABLET | Freq: Four times a day (QID) | ORAL | 0 refills | Status: DC

## 2019-08-08 MED ORDER — PREDNISONE 20 MG PO TABS
60.0000 mg | ORAL_TABLET | Freq: Once | ORAL | Status: AC
  Administered 2019-08-08: 60 mg via ORAL
  Filled 2019-08-08: qty 3

## 2019-08-08 NOTE — Discharge Instructions (Signed)
Please read and follow all provided instructions.  Your diagnoses today include:  1. Chronic left-sided low back pain with left-sided sciatica   2. Aortic atherosclerosis (HCC)     Tests performed today include:  Vital signs - see below for your results today  X-ray of the low back - no broken bones but we can see some calcium build-up in the aorta.   Medications prescribed:   Prednisone - steroid medicine   It is best to take this medication in the morning to prevent sleeping problems. If you are diabetic, monitor your blood sugar closely and stop taking Prednisone if blood sugar is over 300. Take with food to prevent stomach upset.    Robaxin (methocarbamol) - muscle relaxer medication  DO NOT drive or perform any activities that require you to be awake and alert because this medicine can make you drowsy.   Take any prescribed medications only as directed.  Home care instructions:   Follow any educational materials contained in this packet  Please rest, use ice or heat on your back for the next several days  Do not lift, push, pull anything more than 10 pounds for the next week  Follow-up instructions: Please follow-up with your primary care provider in the next 1 week for further evaluation of your symptoms.   Return instructions:  SEEK IMMEDIATE MEDICAL ATTENTION IF YOU HAVE:  New numbness, tingling, weakness, or problem with the use of your arms or legs  Severe back pain not relieved with medications  Loss control of your bowels or bladder  Increasing pain in any areas of the body (such as chest or abdominal pain)  Shortness of breath, dizziness, or fainting.   Worsening nausea (feeling sick to your stomach), vomiting, fever, or sweats  Any other emergent concerns regarding your health   Additional Information:  Your vital signs today were: BP (!) 140/107 (BP Location: Left Arm)    Pulse (!) 58    Temp 98.8 F (37.1 C) (Oral)    Resp 18    Ht 6\' 3"   (1.905 m)    Wt 89.4 kg    SpO2 100%    BMI 24.62 kg/m  If your blood pressure (BP) was elevated above 135/85 this visit, please have this repeated by your doctor within one month. --------------

## 2019-08-08 NOTE — ED Notes (Signed)
Patient ready to leave did not want discharge vital signs taken.

## 2019-08-08 NOTE — ED Triage Notes (Signed)
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 Saw pcp who gave him extra strength motrin but has had no scans done, reports sharp L sided back pain when moving. Denies loss of bowel or bladder.

## 2019-08-08 NOTE — ED Provider Notes (Signed)
[place] EMERGENCY DEPARTMENT Provider Note   CSN: [id number] Arrival date & time: 08/08/19  4818     History   Chief Complaint Chief Complaint  Patient presents with  . Back Pain    HPI Jake Smith is a 48 y.o. male.     Patient with history of chronic back pain presents with worsening of his chronic back pain over the past few weeks.  Patient reports pain in his lower back that moves towards the left side.  He gets sharp shooting pains down into his leg at times.  He states that sometimes he feels weak in his leg but this is not constant.  Patient states that over the past several days he has had pain that shoots through his left arm and left leg.  He denies any neck pain or neck injury.  No new injuries to the lower back.  He does have a handicapped son who he needs to move.  He takes 800 mg ibuprofen for his back pain.  This helps temporarily reduce the amount of pain but does not relieve it completely.  Patient denies warning symptoms of back pain including: fecal incontinence, urinary retention or overflow incontinence, night sweats, waking from sleep with back pain, unexplained fevers or weight loss, h/o cancer, IVDU, recent trauma.        History reviewed. No pertinent past medical history.  There are no active problems to display for this patient.   History reviewed. No pertinent surgical history.      Home Medications    Prior to Admission medications   Medication Sig Start Date End Date Taking? Authorizing Provider  ibuprofen (ADVIL,MOTRIN) 200 MG tablet Take 400 mg by mouth every 6 (six) hours as needed. For pain.    [provider]  methocarbamol (ROBAXIN) 500 MG tablet Take 2 tablets (1,000 mg total) by mouth 4 (four) times daily. 08/08/19   Carlisle Cater, PA-C  predniSONE (DELTASONE) 20 MG tablet 3 Tabs PO Days 1-3, then 2 tabs PO Days 4-6, then 1 tab PO Day 7-9, then Half Tab PO Day 10-12 08/08/19   Carlisle Cater, PA-C     Family History No family history on file.  Social History Social History   Tobacco Use  . Smoking status: Current Every Day Smoker    Types: Cigarettes, Cigars  . Smokeless tobacco: Never Used  Substance Use Topics  . Alcohol use: Yes    Comment: social   . Drug use: Yes    Types: Marijuana     Allergies   Shellfish allergy and Tylenol [acetaminophen]   Review of Systems Review of Systems  Constitutional: Negative for fever and unexpected weight change.  Gastrointestinal: Negative for constipation.       Neg for fecal incontinence  Genitourinary: Negative for difficulty urinating, flank pain and hematuria.       Negative for urinary incontinence or retention  Musculoskeletal: Positive for back pain and myalgias.  Neurological: Positive for weakness (intermittent). Negative for numbness.       Negative for saddle paresthesias      Physical Exam Updated Vital Signs BP (!) 140/107 (BP Location: Left Arm)   Pulse (!) 58   Temp 98.8 F (37.1 C) (Oral)   Resp 18   Ht 6\' 3"  (1.905 m)   Wt 89.4 kg   SpO2 100%   BMI 24.62 kg/m   Physical Exam Vitals signs and nursing note reviewed.  Constitutional:      Appearance: He  is well-developed.  HENT:     Head: Normocephalic and atraumatic.  Eyes:     Conjunctiva/sclera: Conjunctivae normal.  Neck:     Musculoskeletal: Normal range of motion.  Abdominal:     Palpations: Abdomen is soft.     Tenderness: There is no abdominal tenderness.  Musculoskeletal: Normal range of motion.        General: No tenderness.     Comments: No step-off noted with palpation of spine.   Skin:    General: Skin is warm and dry.  Neurological:     Mental Status: He is alert.     Sensory: No sensory deficit.     Motor: No abnormal muscle tone.     Deep Tendon Reflexes: Reflexes are normal and symmetric.     Comments: 5/5 strength in entire lower extremities bilaterally. No sensation deficit.       ED Treatments / Results  Labs  (all labs ordered are listed, but only abnormal results are displayed) Labs Reviewed - No data to display  EKG None  Radiology Dg Lumbar Spine Complete  Result Date: 08/08/2019 CLINICAL DATA:  Chronic progressive low back pain. Left lower extremity radiculopathy. EXAM: LUMBAR SPINE - COMPLETE 4+ VIEW COMPARISON:  None. FINDINGS: There is no evidence of lumbar spine fracture. Alignment is normal. Intervertebral disc spaces are maintained. No appreciable facet arthritis of significance. Aortic atherosclerosis. IMPRESSION: 1. Normal lumbar spine. 2. Aortic atherosclerosis. Electronically Signed   By: Francene Boyers M.D.   On: 08/08/2019 10:53    Procedures Procedures (including critical care time)  Medications Ordered in ED Medications  predniSONE (DELTASONE) tablet 60 mg (has no administration in time range)     Initial Impression / Assessment and Plan / ED Course  I have reviewed the triage vital signs and the nursing notes.  Pertinent labs & imaging results that were available during my care of the patient were reviewed by me and considered in my medical decision making (see chart for details).        10:47 AM Patient seen and examined.  We will obtain plain films.  If reassuring, plan course of steroids, muscle relaxer.  Continued NSAIDs.  Vital signs reviewed and are as follows: Vitals:   08/08/19 0847  BP: (!) 140/107  Pulse: (!) 58  Resp: 18  Temp: 98.8 F (37.1 C)  SpO2: 100%   11:17 AM x-rays reviewed.  Aortic atherosclerosis without aneurysm noted.  No fractures or suspicious lesions in the lumbar spine.  Will give taper course of prednisone to see if this helps.  No red flag s/s of low back pain. Patient was counseled on back pain precautions and told to do activity as tolerated but do not lift, push, or pull heavy objects more than 10 pounds for the next week.  Patient counseled to use ice or heat on back for no longer than 15 minutes every hour.   Patient  counseled on proper use of muscle relaxant medication.  They were told not to drink alcohol, drive any vehicle, or do any dangerous activities while taking this medication.  Patient verbalized understanding.  Patient urged to follow-up with PCP if pain does not improve with treatment and rest or if pain becomes recurrent. Urged to return with worsening severe pain, loss of bowel or bladder control, trouble walking.   The patient verbalizes understanding and agrees with the plan.    Final Clinical Impressions(s) / ED Diagnoses   Final diagnoses:  Chronic left-sided low back pain  with left-sided sciatica  Aortic atherosclerosis (HCC)   Patient with back pain.  No suspicious findings on imaging.  Incidental aortic atherosclerosis without aneurysm however patient does not have abdominal pain or symptoms consistent with AAA or dissection.  No neurological deficits here today. Patient is ambulatory. No warning symptoms of back pain including: fecal incontinence, urinary retention or overflow incontinence, night sweats, waking from sleep with back pain, unexplained fevers or weight loss, h/o cancer, IVDU, recent trauma. No concern for cauda equina, epidural abscess, or other serious cause of back pain. Conservative measures such as rest, ice/heat and pain medicine indicated with PCP follow-up if no improvement with conservative management.    ED Discharge Orders         Ordered    predniSONE (DELTASONE) 20 MG tablet     08/08/19 1114    methocarbamol (ROBAXIN) 500 MG tablet  4 times daily     08/08/19 1114           Renne CriglerGeiple, Bard Haupert, PA-C 08/08/19 1118    Jacalyn LefevreHaviland, Julie, MD 08/08/19 1132

## 2020-01-24 IMAGING — CR DG LUMBAR SPINE COMPLETE 4+V
5 series · 5 of 5 positions shown · non-contrast
Comparison: None.

CLINICAL DATA: Chronic progressive low back pain. Left lower
extremity radiculopathy.

EXAM:
LUMBAR SPINE - COMPLETE 4+ VIEW

[l-spine ap]
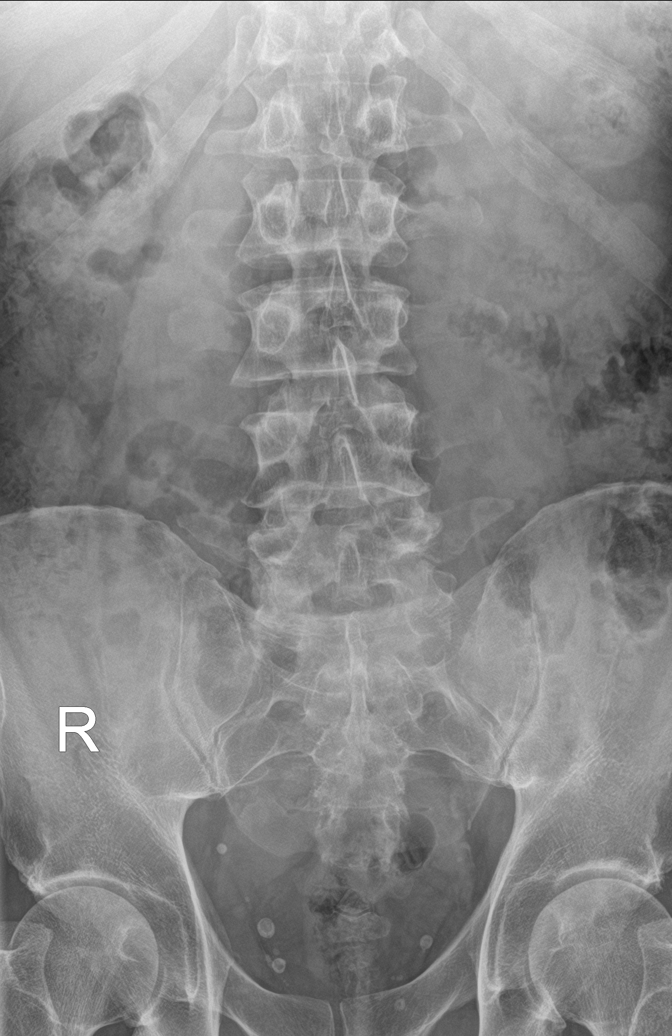

[l-spine obl (1 of 2)]
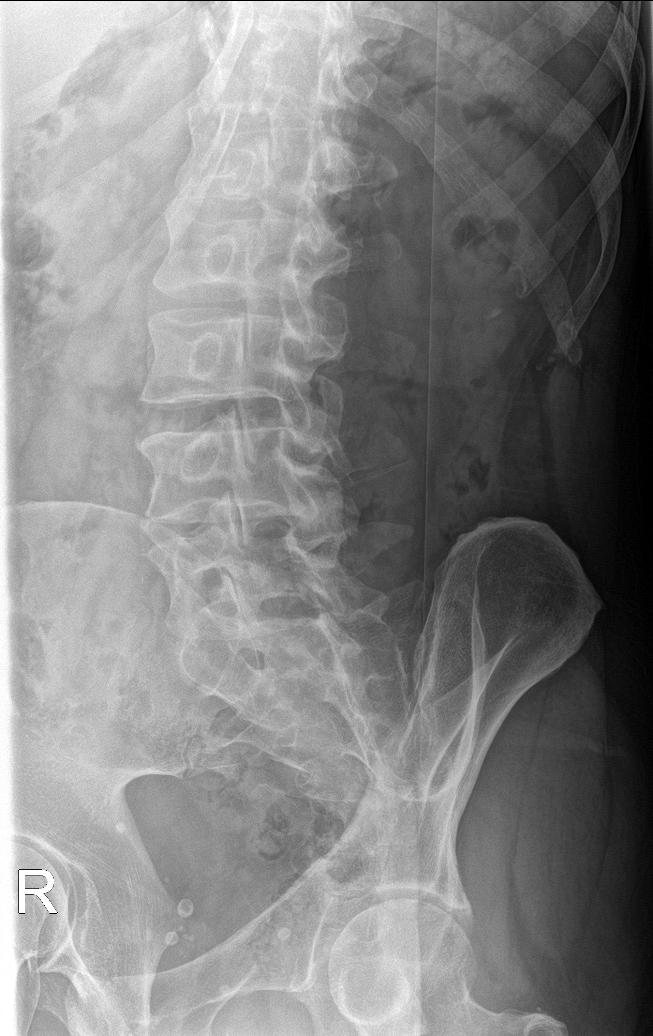

[l-spine obl (2 of 2)]
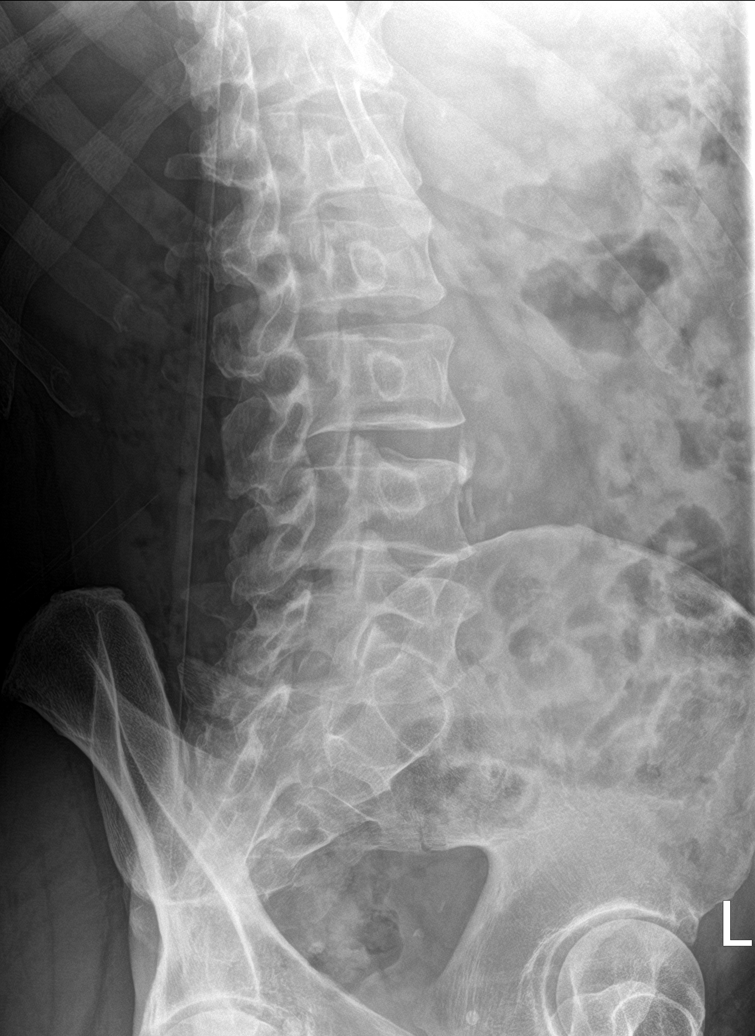

[l-spine lat]
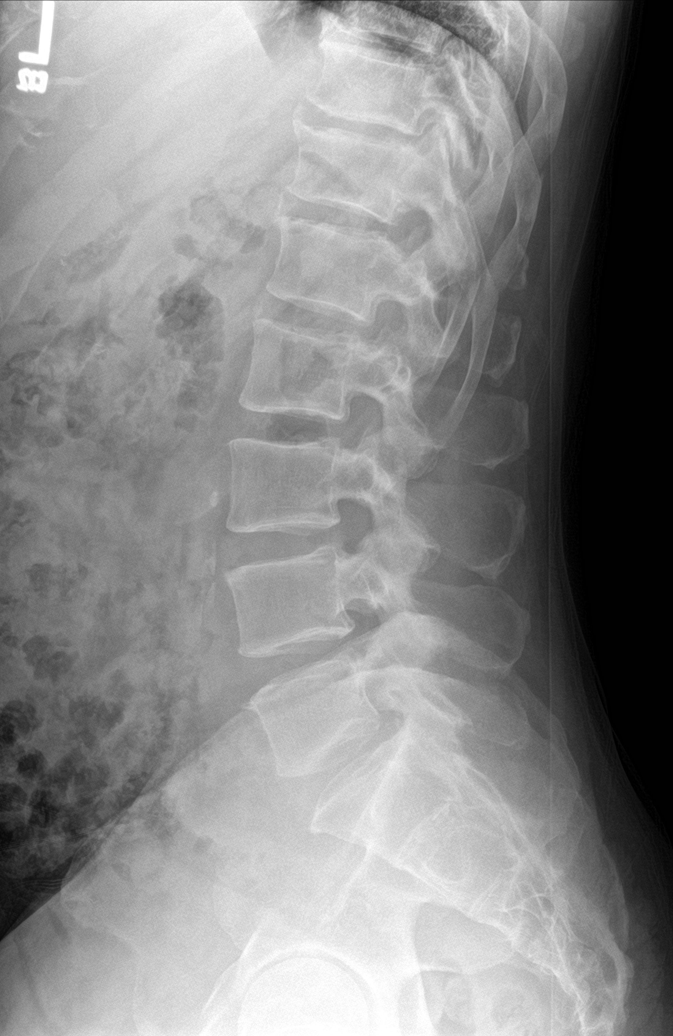

[l-spine spot]
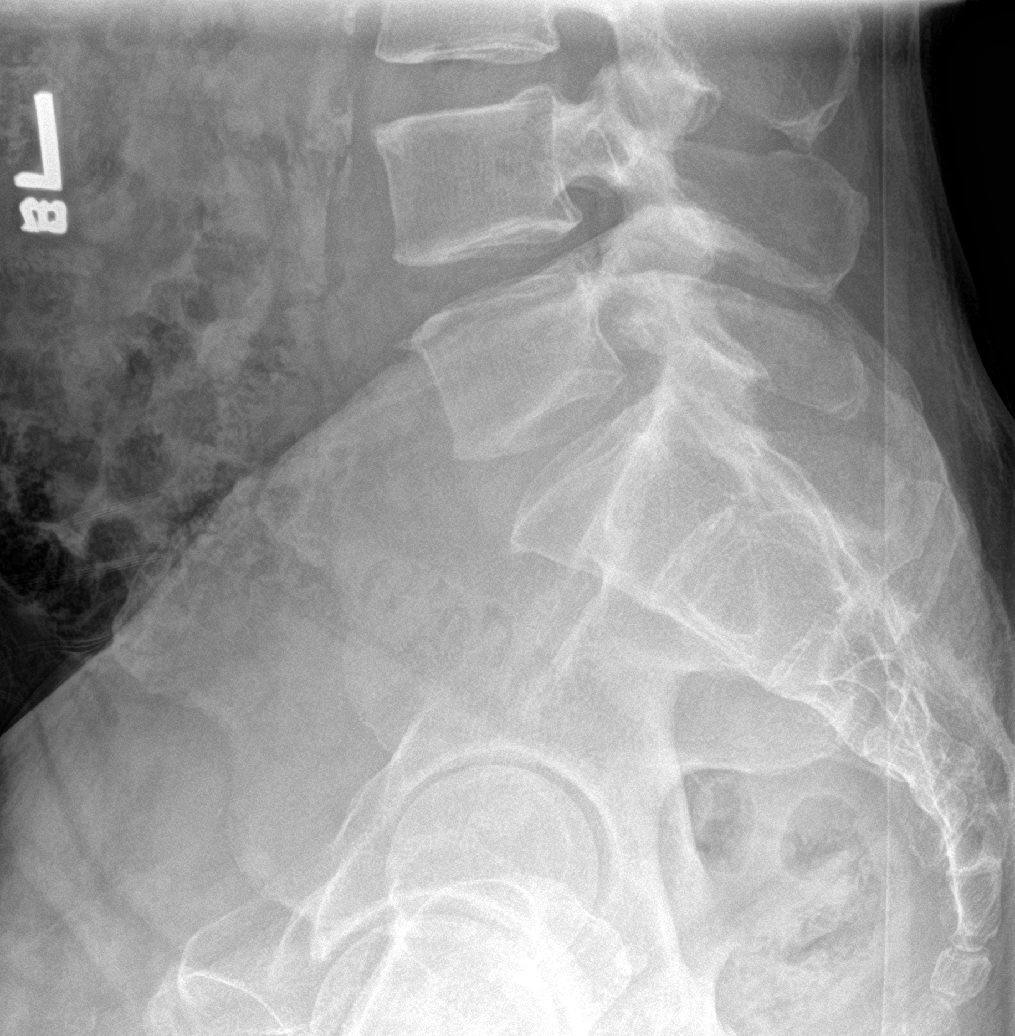

[5 of 5 positions shown; findings below may reference images not displayed]

FINDINGS: There is no evidence of lumbar spine fracture. Alignment is normal.
Intervertebral disc spaces are maintained. No appreciable facet
arthritis of significance. Aortic atherosclerosis.
IMPRESSION: 1. Normal lumbar spine.
2. Aortic atherosclerosis.

## 2020-05-14 ENCOUNTER — Encounter (HOSPITAL_COMMUNITY): Payer: Self-pay

## 2020-05-14 ENCOUNTER — Emergency Department (HOSPITAL_COMMUNITY)
Admission: EM | Admit: 2020-05-14 | Discharge: 2020-05-14 | Disposition: A | Discharge status: Home / Self Care | Payer: Self-pay | Attending: Emergency Medicine | Admitting: Emergency Medicine

## 2020-05-14 ENCOUNTER — Other Ambulatory Visit: Payer: Self-pay

## 2020-05-14 DIAGNOSIS — Z5321 Procedure and treatment not carried out due to patient leaving prior to being seen by health care provider: Secondary | ICD-10-CM | POA: Insufficient documentation

## 2020-05-14 DIAGNOSIS — L02212 Cutaneous abscess of back [any part, except buttock]: Secondary | ICD-10-CM | POA: Insufficient documentation

## 2020-05-14 HISTORY — DX: Essential (primary) hypertension: I10

## 2020-05-14 HISTORY — DX: Tourette's disorder: F95.2

## 2020-05-14 HISTORY — DX: Sciatica, unspecified side: M54.30

## 2020-05-14 HISTORY — DX: Parkinson's disease without dyskinesia, without mention of fluctuations: G20.A1

## 2020-05-14 HISTORY — DX: Depression, unspecified: F32.A

## 2020-05-14 NOTE — ED Triage Notes (Signed)
Patient c/o abscess to the back 3 weeks ago.

## 2020-05-14 NOTE — ED Notes (Signed)
Called patient for vital sign recheck no one responded

## 2020-11-06 ENCOUNTER — Encounter (HOSPITAL_COMMUNITY): Payer: Self-pay

## 2020-11-06 ENCOUNTER — Emergency Department (HOSPITAL_COMMUNITY): Payer: Self-pay

## 2020-11-06 ENCOUNTER — Other Ambulatory Visit: Payer: Self-pay

## 2020-11-06 ENCOUNTER — Emergency Department (HOSPITAL_COMMUNITY)
Admission: EM | Admit: 2020-11-06 | Discharge: 2020-11-06 | Disposition: A | Discharge status: Home / Self Care | Payer: Self-pay | Attending: Emergency Medicine | Admitting: Emergency Medicine

## 2020-11-06 DIAGNOSIS — R109 Unspecified abdominal pain: Secondary | ICD-10-CM | POA: Insufficient documentation

## 2020-11-06 DIAGNOSIS — M545 Low back pain, unspecified: Secondary | ICD-10-CM | POA: Insufficient documentation

## 2020-11-06 DIAGNOSIS — R300 Dysuria: Secondary | ICD-10-CM | POA: Insufficient documentation

## 2020-11-06 DIAGNOSIS — F17210 Nicotine dependence, cigarettes, uncomplicated: Secondary | ICD-10-CM | POA: Insufficient documentation

## 2020-11-06 DIAGNOSIS — I10 Essential (primary) hypertension: Secondary | ICD-10-CM | POA: Insufficient documentation

## 2020-11-06 DIAGNOSIS — R319 Hematuria, unspecified: Secondary | ICD-10-CM | POA: Insufficient documentation

## 2020-11-06 LAB — COMPREHENSIVE METABOLIC PANEL
ALT: 23 U/L (ref 0–44)
AST: 25 U/L (ref 15–41)
Albumin: 3.4 g/dL — ABNORMAL LOW (ref 3.5–5.0)
Alkaline Phosphatase: 42 U/L (ref 38–126)
Anion gap: 9 (ref 5–15)
BUN: 17 mg/dL (ref 6–20)
CO2: 22 mmol/L (ref 22–32)
Calcium: 9 mg/dL (ref 8.9–10.3)
Chloride: 104 mmol/L (ref 98–111)
Creatinine, Ser: 1.27 mg/dL — ABNORMAL HIGH (ref 0.61–1.24)
GFR, Estimated: >60 mL/min (ref >60)
Glucose, Bld: 119 mg/dL — ABNORMAL HIGH (ref 70–99)
Potassium: 3.9 mmol/L (ref 3.5–5.1)
Sodium: 135 mmol/L (ref 135–145)
Total Bilirubin: 0.5 mg/dL (ref 0.3–1.2)
Total Protein: 5.8 g/dL — ABNORMAL LOW (ref 6.5–8.1)

## 2020-11-06 LAB — URINALYSIS, ROUTINE W REFLEX MICROSCOPIC

## 2020-11-06 LAB — CBC
HCT: 49.1 % (ref 39.0–52.0)
Hemoglobin: 16.4 g/dL (ref 13.0–17.0)
MCH: 31.3 pg (ref 26.0–34.0)
MCHC: 33.4 g/dL (ref 30.0–36.0)
MCV: 93.7 fL (ref 80.0–100.0)
Platelets: 305 10*3/uL (ref 150–400)
RBC: 5.24 MIL/uL (ref 4.22–5.81)
RDW: 14.3 % (ref 11.5–15.5)
WBC: 14.3 10*3/uL — ABNORMAL HIGH (ref 4.0–10.5)
nRBC: 0 % (ref 0.0–0.2)

## 2020-11-06 LAB — URINALYSIS, MICROSCOPIC (REFLEX)
RBC / HPF: >50 RBC/hpf (ref 0–5)
Squamous Epithelial / HPF: NONE SEEN (ref 0–5)

## 2020-11-06 MED ORDER — CEPHALEXIN 500 MG PO CAPS: 500.0000 mg | ORAL_CAPSULE | Freq: Four times a day (QID) | ORAL | 0 refills | Status: DC

## 2020-11-06 NOTE — ED Notes (Signed)
Pt went outside to smoke

## 2020-11-06 NOTE — ED Notes (Signed)
Walked patient to the bathroom patient did well 

## 2020-11-06 NOTE — ED Triage Notes (Signed)
Pt reports he is here today due to hematuria and bilateral flank pain. Pt reports it started x3 days ago. Pt reports it is having a difficult time urinating.

## 2020-11-06 NOTE — ED Provider Notes (Signed)
[facility] EMERGENCY DEPARTMENT Provider Note   CSN: [id number] Arrival date & time: 11/06/20  1129     History Chief Complaint  Patient presents with  . Hematuria  . Flank Pain    Jake Smith is a 50 y.o. male.  HPI Patient presents with hematuria.  States started on 3 days ago.  States the amount of blood is increasing.  States now mostly blood.  States mild dysuria.  Also some low back pain.  States he got seen a couple days ago at the [facility] and was given a medicine.  Unsure what it was but states he had a rectal exam before he was given the medicine.  No other bleeding.  States he will rarely get a nosebleed the last 1 was around a month ago.  No lightheadedness or dizziness.  No flank pain.  Not on anticoagulation.    Past Medical History:  Diagnosis Date  . Depression   . Hypertension   . Parkinson's disease (HCC)   . Sciatica   . Tourette's     There are no problems to display for this patient.   Past Surgical History:  Procedure Laterality Date  . HERNIA REPAIR         Family History  Problem Relation Age of Onset  . Multiple sclerosis Father     Social History   Tobacco Use  . Smoking status: Current Every Day Smoker    Types: Cigarettes, Cigars  . Smokeless tobacco: Never Used  Vaping Use  . Vaping Use: Never used  Substance Use Topics  . Alcohol use: Yes    Comment: social   . Drug use: Yes    Types: Marijuana    Home Medications Prior to Admission medications   Medication Sig Start Date End Date Taking? Authorizing Provider  cephALEXin (KEFLEX) 500 MG capsule Take 1 capsule (500 mg total) by mouth 4 (four) times daily. 11/06/20  Yes Benjiman Core, MD  ibuprofen (ADVIL,MOTRIN) 200 MG tablet Take 400 mg by mouth every 6 (six) hours as needed. For pain.    [provider]  methocarbamol (ROBAXIN) 500 MG tablet Take 2 tablets (1,000 mg total) by mouth 4 (four) times daily. 08/08/19   Renne Crigler, PA-C   predniSONE (DELTASONE) 20 MG tablet 3 Tabs PO Days 1-3, then 2 tabs PO Days 4-6, then 1 tab PO Day 7-9, then Half Tab PO Day 10-12 08/08/19   Renne Crigler, PA-C    Allergies    Shellfish allergy and Tylenol [acetaminophen]  Review of Systems   Review of Systems  Constitutional: Negative for appetite change.  HENT: Negative for congestion.   Respiratory: Negative for shortness of breath.   Gastrointestinal: Negative for abdominal pain.  Genitourinary: Positive for hematuria.  Musculoskeletal: Positive for back pain.  Neurological: Negative for weakness.  Psychiatric/Behavioral: Negative for confusion.    Physical Exam Updated Vital Signs BP 117/73 (BP Location: Left Arm)   Pulse 80   Temp 98.7 F (37.1 C) (Oral)   Resp 16   SpO2 99%   Physical Exam Vitals and nursing note reviewed.  HENT:     Head: Atraumatic.  Eyes:     Pupils: Pupils are equal, round, and reactive to light.  Cardiovascular:     Rate and Rhythm: Regular rhythm.  Pulmonary:     Breath sounds: No wheezing or rhonchi.  Abdominal:     Tenderness: There is no abdominal tenderness.  Genitourinary:    Comments: No CVA tenderness.  No blood at meatus. Musculoskeletal:     Cervical back: Neck supple.     Comments: Mild lumbar tenderness.  No deformity.  No CVA tenderness.  Skin:    General: Skin is warm.     Capillary Refill: Capillary refill takes less than 2 seconds.  Neurological:     General: No focal deficit present.     Mental Status: He is alert.  Psychiatric:        Mood and Affect: Mood normal.     ED Results / Procedures / Treatments   Labs (all labs ordered are listed, but only abnormal results are displayed) Labs Reviewed  URINALYSIS, ROUTINE W REFLEX MICROSCOPIC - Abnormal; Notable for the following components:      Result Value   Color, Urine RED (*)    APPearance TURBID (*)    Glucose, UA   (*)    Value: TEST NOT REPORTED DUE TO COLOR INTERFERENCE OF URINE PIGMENT   Hgb urine  dipstick   (*)    Value: TEST NOT REPORTED DUE TO COLOR INTERFERENCE OF URINE PIGMENT   Bilirubin Urine   (*)    Value: TEST NOT REPORTED DUE TO COLOR INTERFERENCE OF URINE PIGMENT   Ketones, ur   (*)    Value: TEST NOT REPORTED DUE TO COLOR INTERFERENCE OF URINE PIGMENT   Protein, ur   (*)    Value: TEST NOT REPORTED DUE TO COLOR INTERFERENCE OF URINE PIGMENT   Nitrite   (*)    Value: TEST NOT REPORTED DUE TO COLOR INTERFERENCE OF URINE PIGMENT   Leukocytes,Ua   (*)    Value: TEST NOT REPORTED DUE TO COLOR INTERFERENCE OF URINE PIGMENT   All other components within normal limits  CBC - Abnormal; Notable for the following components:   WBC 14.3 (*)    All other components within normal limits  COMPREHENSIVE METABOLIC PANEL - Abnormal; Notable for the following components:   Glucose, Bld 119 (*)    Creatinine, Ser 1.27 (*)    Total Protein 5.8 (*)    Albumin 3.4 (*)    All other components within normal limits  URINALYSIS, MICROSCOPIC (REFLEX) - Abnormal; Notable for the following components:   Bacteria, UA MANY (*)    All other components within normal limits  URINE CULTURE    EKG None  Radiology CT Renal Stone Study  Result Date: 11/06/2020 CLINICAL DATA:  Hematuria EXAM: CT ABDOMEN AND PELVIS WITHOUT CONTRAST TECHNIQUE: Multidetector CT imaging of the abdomen and pelvis was performed following the standard protocol without IV contrast. COMPARISON:  None. FINDINGS: Lower chest: Lung bases are clear. Hepatobiliary: Normal Pancreas: Normal Spleen: Normal Adrenals/Urinary Tract: Adrenal glands are normal. The kidneys are normal. No cyst, mass, stone or hydronephrosis. Note that this is a noncontrast examination. No stone in either ureter. There is a slightly hyperdense filling defect dependent in the bladder consistent with a blood clot. Cannot rule out a mass at the bladder floor. This may relate primarily to an enlarged prostate. Stomach/Bowel: Normal Vascular/Lymphatic: Aortic  atherosclerosis. No aneurysm. IVC is normal. No adenopathy. Reproductive: Normal Other: No free fluid or air. Musculoskeletal: Ordinary lower lumbar degenerative changes. IMPRESSION: 1. Slightly hyperdense filling defect dependent in the bladder consistent with a blood clot. Cannot rule out a mass at the bladder floor. This may relate primarily to an enlarged prostate. 2. No evidence of urinary tract stone disease or other upper urinary tract pathology. Note that this is a noncontrast examination. 3. Aortic atherosclerosis.  4. Ordinary lower lumbar degenerative changes. Aortic Atherosclerosis (ICD10-I70.0). Electronically Signed   By: Paulina Fusi M.D.   On: 11/06/2020 13:44    Procedures Procedures   Medications Ordered in ED Medications - No data to display  ED Course  I have reviewed the triage vital signs and the nursing notes.  Pertinent labs & imaging results that were available during my care of the patient were reviewed by me and considered in my medical decision making (see chart for details).    MDM Rules/Calculators/A&P                          Patient with hematuria.  Began a few days ago.  Lab work reassuring.  Although urine shows possible infection.  Will treat and cultures been sent.  CT scan showed likely clot in the bladder but also potentially could be a mass.  Discussed with Dr. Alvester Morin about follow-up.  States no more needs to be done in the ER and can be seen as a follow-up.  Patient get seen at the [facility] and Dr. Alvester Morin states there is a resident that helps with getting some of the charity care to be seen.  Will discharge home.  Patient instructed on what to watch for for return such as worsening lightheadedness or difficulty urinating. Final Clinical Impression(s) / ED Diagnoses Final diagnoses:  Hematuria, unspecified type    Rx / DC Orders ED Discharge Orders         Ordered    cephALEXin (KEFLEX) 500 MG capsule  4 times daily        11/06/20 1513            Benjiman Core, MD 11/06/20 1610

## 2020-11-08 LAB — URINE CULTURE: Culture: NO GROWTH

## 2021-04-24 IMAGING — CT CT RENAL STONE PROTOCOL
2 of 4 series · 17 of 46 positions shown, 19 images · non-contrast
Comparison: None.

CLINICAL DATA: Hematuria

EXAM:
CT ABDOMEN AND PELVIS WITHOUT CONTRAST
TECHNIQUE: Multidetector CT imaging of the abdomen and pelvis was performed
following the standard protocol without IV contrast.

[Series 3: renal stone 5.0 · axial · 0.72mm/px · z∈[+962,+1377]mm · 14 of 91 slices shown, 16 images]
[im 4/91  soft-tissue]
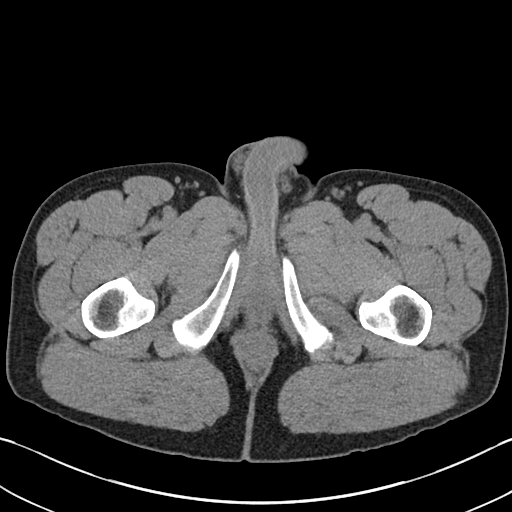
[im 4/91  bone]
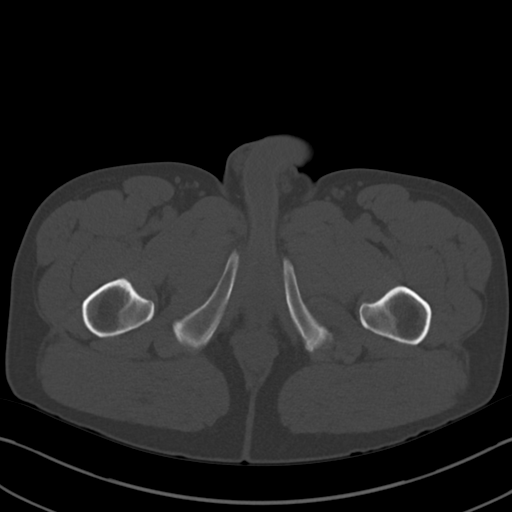
[im 11/91  soft-tissue]
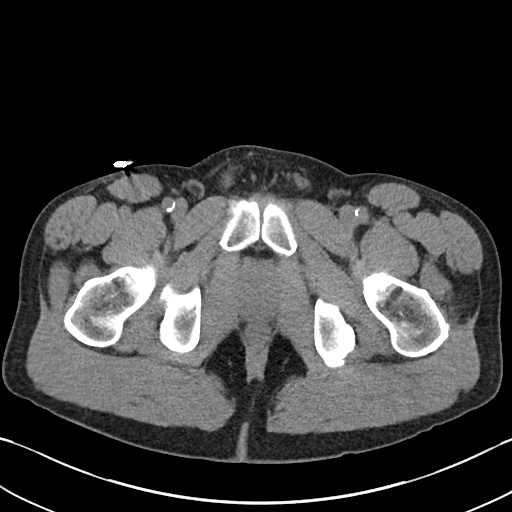
[im 19/91  soft-tissue]
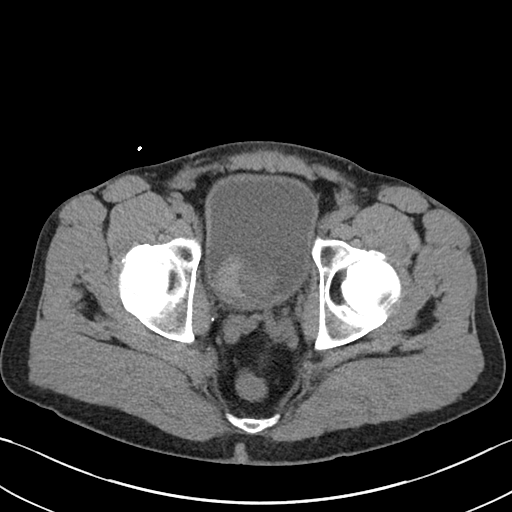
[im 26/91  soft-tissue]
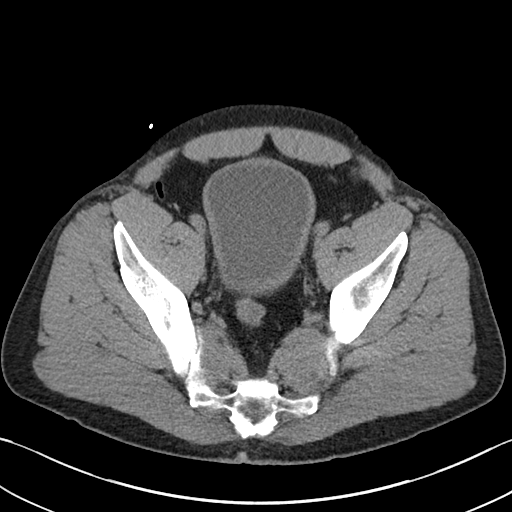
[im 29/91  soft-tissue]
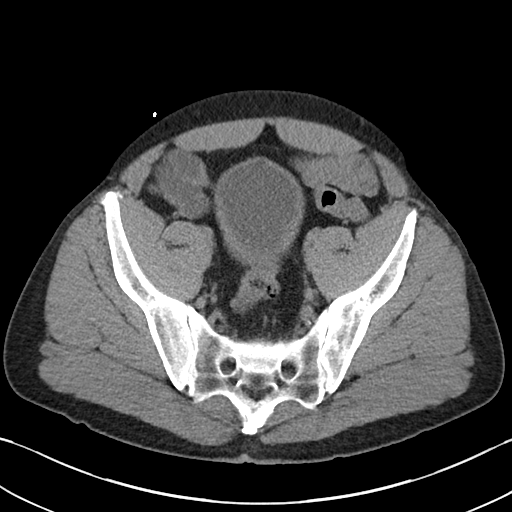
[im 37/91  soft-tissue]
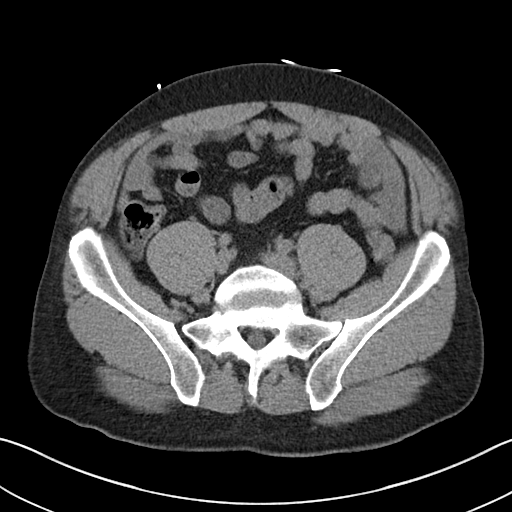
[im 44/91  soft-tissue]
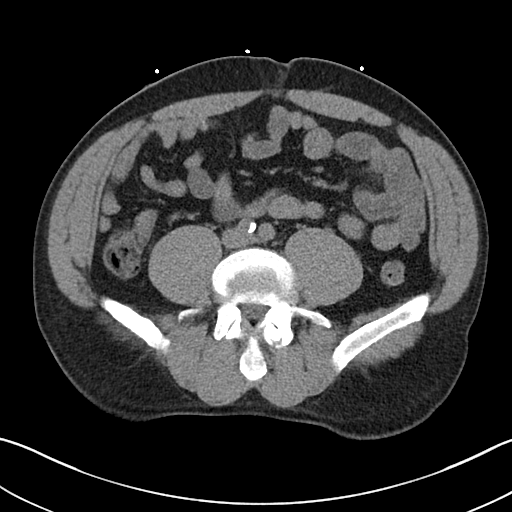
[im 47/91  soft-tissue]
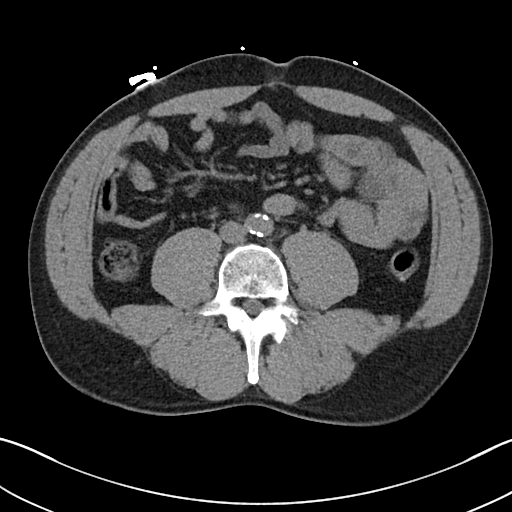
[im 55/91  soft-tissue]
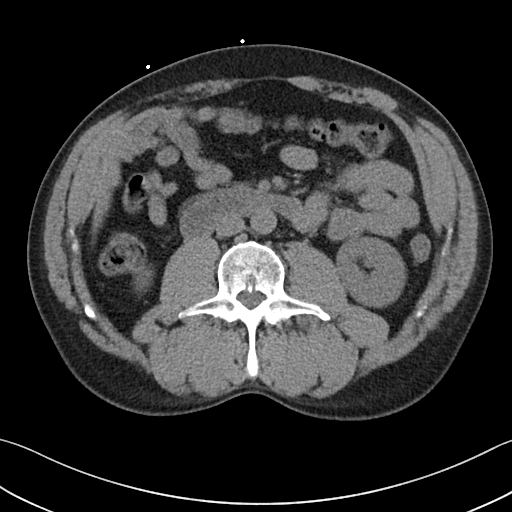
[im 55/91  bone]
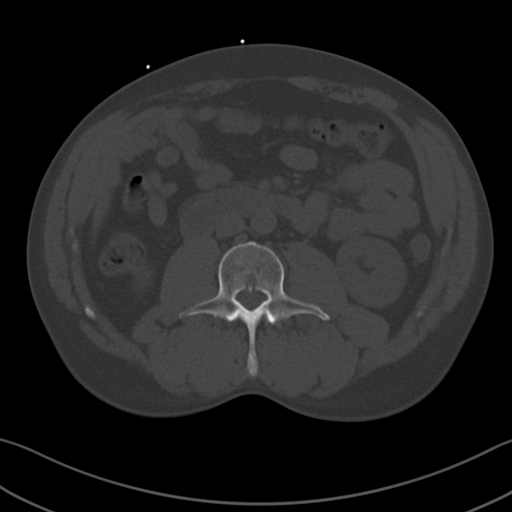
[im 62/91  soft-tissue]
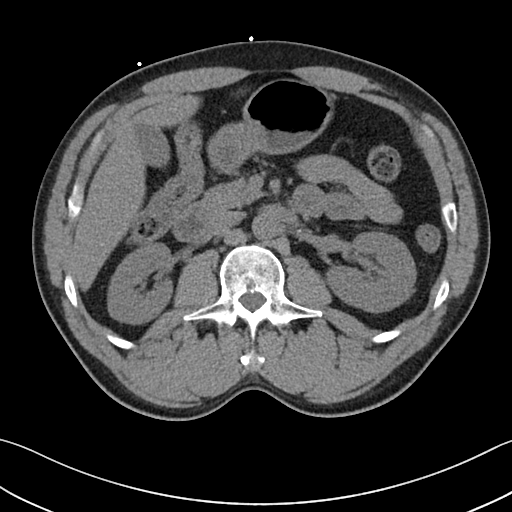
[im 69/91  soft-tissue]
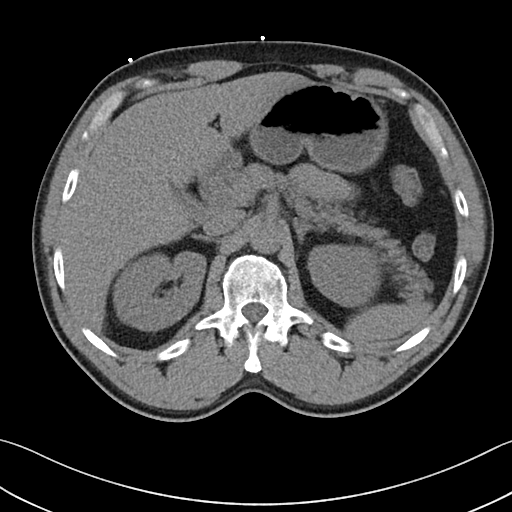
[im 73/91  soft-tissue]
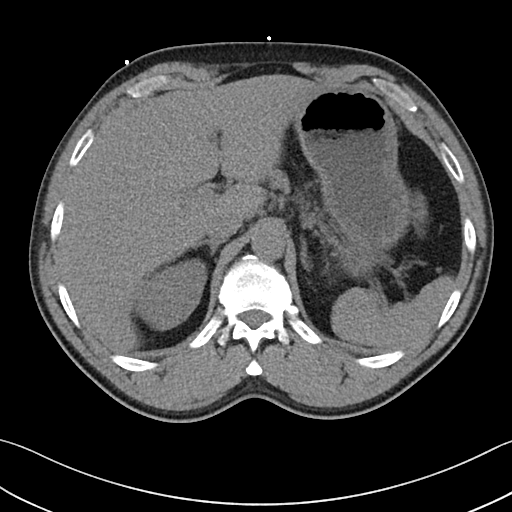
[im 80/91  soft-tissue]
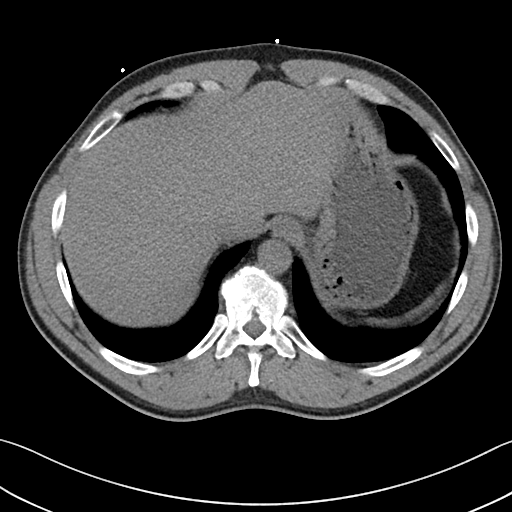
[im 87/91  soft-tissue]
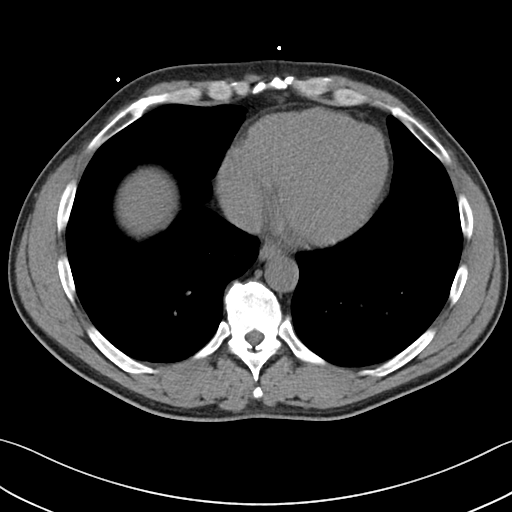

[Series 6: coronal · coronal · 0.83mm/px · 3 of 101 slices shown]
[im 34/101  soft-tissue]
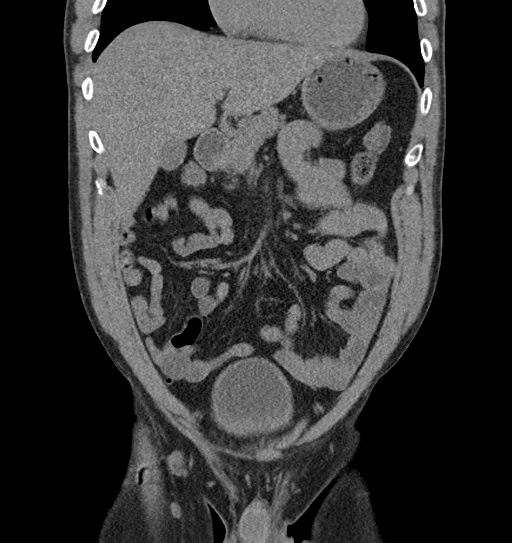
[im 45/101  soft-tissue]
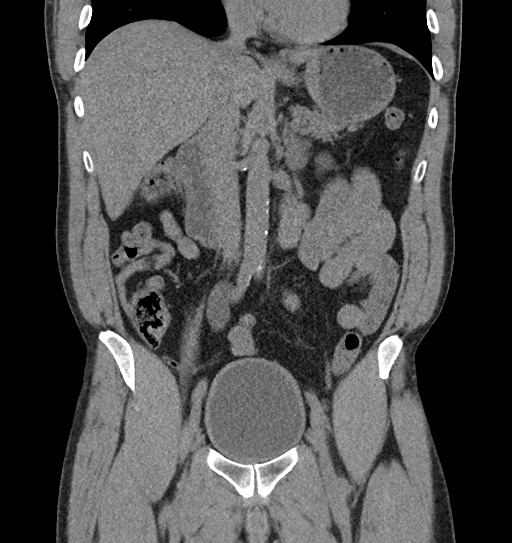
[im 56/101  soft-tissue]
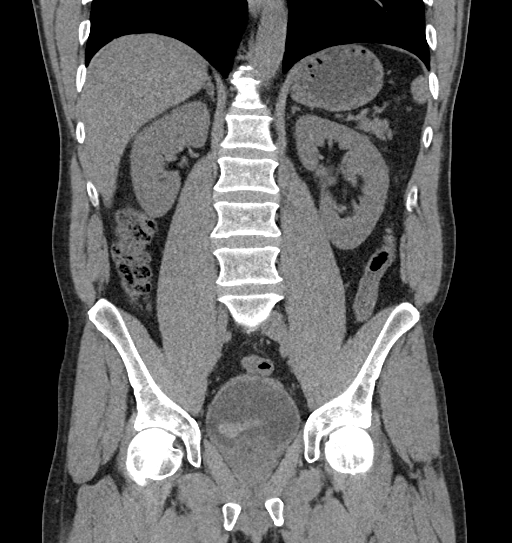

[17 of 46 positions shown; findings below may reference images not displayed]

FINDINGS: Lower chest: Lung bases are clear.

Hepatobiliary: Normal

Pancreas: Normal

Spleen: Normal

Adrenals/Urinary Tract: Adrenal glands are normal. The kidneys are
normal. No cyst, mass, stone or hydronephrosis. Note that this is a
noncontrast examination. No stone in either ureter. There is a
slightly hyperdense filling defect dependent in the bladder
consistent with a blood clot. Cannot rule out a mass at the bladder
floor. This may relate primarily to an enlarged prostate.

Stomach/Bowel: Normal

Vascular/Lymphatic: Aortic atherosclerosis. No aneurysm. IVC is
normal. No adenopathy.

Reproductive: Normal

Other: No free fluid or air.

Musculoskeletal: Ordinary lower lumbar degenerative changes.
IMPRESSION: 1. Slightly hyperdense filling defect dependent in the bladder
consistent with a blood clot. Cannot rule out a mass at the bladder
floor. This may relate primarily to an enlarged prostate.
2. No evidence of urinary tract stone disease or other upper urinary
tract pathology. Note that this is a noncontrast examination.
3. Aortic atherosclerosis.
4. Ordinary lower lumbar degenerative changes.

Aortic Atherosclerosis ([id number].N).

## 2021-05-03 ENCOUNTER — Emergency Department (HOSPITAL_COMMUNITY): Admission: EM | Admit: 2021-05-03 | Discharge: 2021-05-03 | Discharge status: Against Medical Advice | Payer: Self-pay

## 2021-05-03 NOTE — ED Notes (Signed)
Patient asked this tech what the wait time was. Patient stated she was not able to determine how long the wait would be. Patient stated he could not wait hours for a tooth ache and asked this tech to give him information on absesses and tooth pain. This tech informed patient she was not able to give that information and encouraged patient to wait to be seen. Patient left.

## 2021-12-21 ENCOUNTER — Emergency Department (HOSPITAL_COMMUNITY)
Admission: EM | Admit: 2021-12-21 | Discharge: 2021-12-22 | Disposition: A | Discharge status: Home / Self Care | Payer: Self-pay | Attending: Emergency Medicine | Admitting: Emergency Medicine

## 2021-12-21 ENCOUNTER — Other Ambulatory Visit: Payer: Self-pay

## 2021-12-21 ENCOUNTER — Emergency Department (HOSPITAL_COMMUNITY): Payer: Self-pay

## 2021-12-21 DIAGNOSIS — Y92410 Unspecified street and highway as the place of occurrence of the external cause: Secondary | ICD-10-CM | POA: Diagnosis not present

## 2021-12-21 DIAGNOSIS — Y908 Blood alcohol level of 240 mg/100 ml or more: Secondary | ICD-10-CM | POA: Diagnosis not present

## 2021-12-21 DIAGNOSIS — F10220 Alcohol dependence with intoxication, uncomplicated: Secondary | ICD-10-CM | POA: Diagnosis not present

## 2021-12-21 DIAGNOSIS — S29001A Unspecified injury of muscle and tendon of front wall of thorax, initial encounter: Secondary | ICD-10-CM | POA: Diagnosis present

## 2021-12-21 DIAGNOSIS — S2220XA Unspecified fracture of sternum, initial encounter for closed fracture: Secondary | ICD-10-CM | POA: Diagnosis not present

## 2021-12-21 DIAGNOSIS — S2222XA Fracture of body of sternum, initial encounter for closed fracture: Secondary | ICD-10-CM

## 2021-12-21 DIAGNOSIS — F10920 Alcohol use, unspecified with intoxication, uncomplicated: Secondary | ICD-10-CM

## 2021-12-21 LAB — COMPREHENSIVE METABOLIC PANEL
ALT: 58 U/L — ABNORMAL HIGH (ref 0–44)
AST: 38 U/L (ref 15–41)
Albumin: 4.1 g/dL (ref 3.5–5.0)
Alkaline Phosphatase: 43 U/L (ref 38–126)
Anion gap: 10 (ref 5–15)
BUN: 17 mg/dL (ref 6–20)
CO2: 22 mmol/L (ref 22–32)
Calcium: 9.1 mg/dL (ref 8.9–10.3)
Chloride: 108 mmol/L (ref 98–111)
Creatinine, Ser: 1.13 mg/dL (ref 0.61–1.24)
GFR, Estimated: >60 mL/min (ref >60)
Glucose, Bld: 97 mg/dL (ref 70–99)
Potassium: 3.8 mmol/L (ref 3.5–5.1)
Sodium: 140 mmol/L (ref 135–145)
Total Bilirubin: 0.6 mg/dL (ref 0.3–1.2)
Total Protein: 7.1 g/dL (ref 6.5–8.1)

## 2021-12-21 LAB — CBC WITH DIFFERENTIAL/PLATELET
Abs Immature Granulocytes: 0.06 10*3/uL (ref 0.00–0.07)
Basophils Absolute: 0.1 10*3/uL (ref 0.0–0.1)
Basophils Relative: 1 %
Eosinophils Absolute: 0.1 10*3/uL (ref 0.0–0.5)
Eosinophils Relative: 1 %
HCT: 47.6 % (ref 39.0–52.0)
Hemoglobin: 16 g/dL (ref 13.0–17.0)
Immature Granulocytes: 1 %
Lymphocytes Relative: 21 %
Lymphs Abs: 2.1 10*3/uL (ref 0.7–4.0)
MCH: 30.9 pg (ref 26.0–34.0)
MCHC: 33.6 g/dL (ref 30.0–36.0)
MCV: 91.9 fL (ref 80.0–100.0)
Monocytes Absolute: 1 10*3/uL (ref 0.1–1.0)
Monocytes Relative: 10 %
Neutro Abs: 6.7 10*3/uL (ref 1.7–7.7)
Neutrophils Relative %: 66 %
Platelets: 250 10*3/uL (ref 150–400)
RBC: 5.18 MIL/uL (ref 4.22–5.81)
RDW: 13.9 % (ref 11.5–15.5)
WBC: 10.1 10*3/uL (ref 4.0–10.5)
nRBC: 0 % (ref 0.0–0.2)

## 2021-12-21 LAB — ETHANOL: Alcohol, Ethyl (B): 262 mg/dL — ABNORMAL HIGH (ref <10)

## 2021-12-21 MED ORDER — IOHEXOL 300 MG/ML  SOLN
100.0000 mL | Freq: Once | INTRAMUSCULAR | Status: AC | PRN
  Administered 2021-12-21: 100 mL via INTRAVENOUS

## 2021-12-21 MED ORDER — HYDROMORPHONE HCL 1 MG/ML IJ SOLN
0.5000 mg | Freq: Once | INTRAMUSCULAR | Status: AC
  Administered 2021-12-21: 0.5 mg via INTRAVENOUS
  Filled 2021-12-21: qty 1

## 2021-12-21 MED ORDER — SODIUM CHLORIDE 0.9 % IV BOLUS
500.0000 mL | Freq: Once | INTRAVENOUS | Status: AC
  Administered 2021-12-21: 500 mL via INTRAVENOUS

## 2021-12-21 MED ORDER — FENTANYL CITRATE PF 50 MCG/ML IJ SOSY
50.0000 ug | PREFILLED_SYRINGE | Freq: Once | INTRAMUSCULAR | Status: AC
  Administered 2021-12-21: 50 ug via INTRAVENOUS
  Filled 2021-12-21: qty 1

## 2021-12-21 MED ORDER — SODIUM CHLORIDE (PF) 0.9 % IJ SOLN
INTRAMUSCULAR | Status: AC
  Filled 2021-12-21: qty 50
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
## 2021-12-21 NOTE — ED Notes (Signed)
Pt advised U/A needed per MD order. Urinal placed at bedside for collection when possible. ENMiles ?

## 2021-12-22 ENCOUNTER — Other Ambulatory Visit (HOSPITAL_COMMUNITY): Payer: Self-pay | Admitting: Emergency Medicine

## 2021-12-22 ENCOUNTER — Telehealth: Payer: Self-pay | Admitting: *Deleted

## 2021-12-22 MED ORDER — HYDROCODONE-ACETAMINOPHEN 5-325 MG PO TABS: 1.0000 | ORAL_TABLET | Freq: Four times a day (QID) | ORAL | 0 refills | Status: DC | PRN

## 2021-12-22 MED ORDER — IBUPROFEN 200 MG PO TABS: 400.0000 mg | ORAL_TABLET | Freq: Four times a day (QID) | ORAL | 0 refills | Status: DC | PRN

## 2021-12-22 NOTE — ED Provider Notes (Signed)
Patient stable.  No dysrhythmia, no hypoxia ?He is safe for discharge home.  Patient requested that I speak to his girlfriend she was given instructions as well. ?We discussed return precautions.  Incentive spirometer given ?Refer to trauma clinic ?  ?Ripley Fraise, MD ?12/22/21 0142 ? ?
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
## 2021-12-22 NOTE — Telephone Encounter (Signed)
Pt mom called related to Rx not being received at pharmacy...EDCM clarified with EDP Army Melia) to resend.  ? ?
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
## 2023-01-21 DIAGNOSIS — F429 Obsessive-compulsive disorder, unspecified: Secondary | ICD-10-CM | POA: Diagnosis not present

## 2023-04-28 ENCOUNTER — Emergency Department (HOSPITAL_COMMUNITY)
Admission: EM | Admit: 2023-04-28 | Discharge: 2023-04-28 | Disposition: A | Discharge status: Home / Self Care | Payer: Self-pay | Attending: Student | Admitting: Student

## 2023-04-28 ENCOUNTER — Other Ambulatory Visit: Payer: Self-pay

## 2023-04-28 ENCOUNTER — Emergency Department (HOSPITAL_COMMUNITY): Payer: Self-pay

## 2023-04-28 ENCOUNTER — Encounter (HOSPITAL_COMMUNITY): Payer: Self-pay

## 2023-04-28 DIAGNOSIS — M5412 Radiculopathy, cervical region: Secondary | ICD-10-CM | POA: Insufficient documentation

## 2023-04-28 DIAGNOSIS — F17290 Nicotine dependence, other tobacco product, uncomplicated: Secondary | ICD-10-CM | POA: Insufficient documentation

## 2023-04-28 DIAGNOSIS — I10 Essential (primary) hypertension: Secondary | ICD-10-CM | POA: Insufficient documentation

## 2023-04-28 DIAGNOSIS — G20A1 Parkinson's disease without dyskinesia, without mention of fluctuations: Secondary | ICD-10-CM | POA: Insufficient documentation

## 2023-04-28 LAB — COMPREHENSIVE METABOLIC PANEL
ALT: 28 U/L (ref 0–44)
AST: 30 U/L (ref 15–41)
Albumin: 3.8 g/dL (ref 3.5–5.0)
Alkaline Phosphatase: 40 U/L (ref 38–126)
Anion gap: 10 (ref 5–15)
BUN: 22 mg/dL — ABNORMAL HIGH (ref 6–20)
CO2: 20 mmol/L — ABNORMAL LOW (ref 22–32)
Calcium: 8.5 mg/dL — ABNORMAL LOW (ref 8.9–10.3)
Chloride: 108 mmol/L (ref 98–111)
Creatinine, Ser: 1.67 mg/dL — ABNORMAL HIGH (ref 0.61–1.24)
GFR, Estimated: 49 mL/min — ABNORMAL LOW (ref >60)
Glucose, Bld: 76 mg/dL (ref 70–99)
Potassium: 4.2 mmol/L (ref 3.5–5.1)
Sodium: 138 mmol/L (ref 135–145)
Total Bilirubin: 0.5 mg/dL (ref 0.3–1.2)
Total Protein: 6.2 g/dL — ABNORMAL LOW (ref 6.5–8.1)

## 2023-04-28 LAB — URINALYSIS, ROUTINE W REFLEX MICROSCOPIC
Bilirubin Urine: NEGATIVE
Glucose, UA: NEGATIVE mg/dL
Ketones, ur: 5 mg/dL — AB
Leukocytes,Ua: NEGATIVE
Nitrite: NEGATIVE
Protein, ur: NEGATIVE mg/dL
Specific Gravity, Urine: 1.017 (ref 1.005–1.030)
pH: 5 (ref 5.0–8.0)

## 2023-04-28 LAB — BASIC METABOLIC PANEL
Anion gap: 29 — ABNORMAL HIGH (ref 5–15)
BUN: 21 mg/dL — ABNORMAL HIGH (ref 6–20)
CO2: 12 mmol/L — ABNORMAL LOW (ref 22–32)
Calcium: <4 mg/dL — CL (ref 8.9–10.3)
Chloride: 99 mmol/L (ref 98–111)
Creatinine, Ser: 1.77 mg/dL — ABNORMAL HIGH (ref 0.61–1.24)
GFR, Estimated: 46 mL/min — ABNORMAL LOW (ref >60)
Glucose, Bld: 74 mg/dL (ref 70–99)
Potassium: >7.5 mmol/L (ref 3.5–5.1)
Sodium: 140 mmol/L (ref 135–145)

## 2023-04-28 LAB — I-STAT CHEM 8, ED
BUN: 22 mg/dL — ABNORMAL HIGH (ref 6–20)
Calcium, Ion: 1.09 mmol/L — ABNORMAL LOW (ref 1.15–1.40)
Chloride: 107 mmol/L (ref 98–111)
Creatinine, Ser: 1.9 mg/dL — ABNORMAL HIGH (ref 0.61–1.24)
Glucose, Bld: 75 mg/dL (ref 70–99)
HCT: 47 % (ref 39.0–52.0)
Hemoglobin: 16 g/dL (ref 13.0–17.0)
Potassium: 4.2 mmol/L (ref 3.5–5.1)
Sodium: 140 mmol/L (ref 135–145)
TCO2: 21 mmol/L — ABNORMAL LOW (ref 22–32)

## 2023-04-28 LAB — CBC
HCT: 47.4 % (ref 39.0–52.0)
Hemoglobin: 15.9 g/dL (ref 13.0–17.0)
MCH: 30.4 pg (ref 26.0–34.0)
MCHC: 33.5 g/dL (ref 30.0–36.0)
MCV: 90.6 fL (ref 80.0–100.0)
Platelets: 300 10*3/uL (ref 150–400)
RBC: 5.23 MIL/uL (ref 4.22–5.81)
RDW: 14.5 % (ref 11.5–15.5)
WBC: 15.6 10*3/uL — ABNORMAL HIGH (ref 4.0–10.5)
nRBC: 0 % (ref 0.0–0.2)

## 2023-04-28 LAB — CBG MONITORING, ED: Glucose-Capillary: 74 mg/dL (ref 70–99)

## 2023-04-28 MED ORDER — METHYLPREDNISOLONE 4 MG PO TBPK: ORAL_TABLET | ORAL | 0 refills | Status: DC

## 2023-04-28 MED ORDER — PREDNISONE 20 MG PO TABS
60.0000 mg | ORAL_TABLET | Freq: Once | ORAL | Status: AC
  Administered 2023-04-28: 60 mg via ORAL
  Filled 2023-04-28: qty 3

## 2023-04-28 NOTE — ED Notes (Signed)
Urinal at bedside for U/A collection per MD order. [employer]
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
## 2023-04-28 NOTE — ED Triage Notes (Signed)
Pt arrived pov. States for the last 3 weeks theas had left arm tingling and weakness. States occasional headache and dizziness. No other symptoms
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
## 2023-06-02 DIAGNOSIS — Z419 Encounter for procedure for purposes other than remedying health state, unspecified: Secondary | ICD-10-CM | POA: Diagnosis not present

## 2023-06-24 DIAGNOSIS — F429 Obsessive-compulsive disorder, unspecified: Secondary | ICD-10-CM | POA: Diagnosis not present

## 2023-07-03 DIAGNOSIS — Z419 Encounter for procedure for purposes other than remedying health state, unspecified: Secondary | ICD-10-CM | POA: Diagnosis not present

## 2023-07-07 ENCOUNTER — Ambulatory Visit (INDEPENDENT_AMBULATORY_CARE_PROVIDER_SITE_OTHER): Payer: Medicaid Other | Admitting: Internal Medicine

## 2023-07-07 ENCOUNTER — Encounter: Payer: Self-pay | Admitting: Internal Medicine

## 2023-07-07 VITALS — BP 145/101 | HR 97 | Temp 98.5°F | Wt 200.3 lb

## 2023-07-07 DIAGNOSIS — R399 Unspecified symptoms and signs involving the genitourinary system: Secondary | ICD-10-CM | POA: Insufficient documentation

## 2023-07-07 DIAGNOSIS — T7840XS Allergy, unspecified, sequela: Secondary | ICD-10-CM

## 2023-07-07 DIAGNOSIS — F952 Tourette's disorder: Secondary | ICD-10-CM | POA: Diagnosis not present

## 2023-07-07 DIAGNOSIS — Z0000 Encounter for general adult medical examination without abnormal findings: Secondary | ICD-10-CM

## 2023-07-07 DIAGNOSIS — Z720 Tobacco use: Secondary | ICD-10-CM

## 2023-07-07 DIAGNOSIS — Z9189 Other specified personal risk factors, not elsewhere classified: Secondary | ICD-10-CM

## 2023-07-07 DIAGNOSIS — I10 Essential (primary) hypertension: Secondary | ICD-10-CM | POA: Diagnosis not present

## 2023-07-07 DIAGNOSIS — N179 Acute kidney failure, unspecified: Secondary | ICD-10-CM

## 2023-07-07 DIAGNOSIS — F17210 Nicotine dependence, cigarettes, uncomplicated: Secondary | ICD-10-CM | POA: Diagnosis not present

## 2023-07-07 DIAGNOSIS — R319 Hematuria, unspecified: Secondary | ICD-10-CM | POA: Diagnosis not present

## 2023-07-07 DIAGNOSIS — M5412 Radiculopathy, cervical region: Secondary | ICD-10-CM | POA: Diagnosis not present

## 2023-07-07 DIAGNOSIS — R339 Retention of urine, unspecified: Secondary | ICD-10-CM

## 2023-07-07 DIAGNOSIS — K219 Gastro-esophageal reflux disease without esophagitis: Secondary | ICD-10-CM

## 2023-07-07 LAB — POCT URINALYSIS DIPSTICK
Bilirubin, UA: NEGATIVE
Glucose, UA: NEGATIVE
Ketones, UA: NEGATIVE
Leukocytes, UA: NEGATIVE
Nitrite, UA: NEGATIVE
Protein, UA: NEGATIVE
Spec Grav, UA: <=1.005 — AB (ref 1.010–1.025)
Urobilinogen, UA: 0.2 U/dL
pH, UA: 5.5 (ref 5.0–8.0)

## 2023-07-07 MED ORDER — CARVEDILOL 6.25 MG PO TABS: 6.2500 mg | ORAL_TABLET | Freq: Two times a day (BID) | ORAL | 11 refills | Status: DC

## 2023-07-07 MED ORDER — TAMSULOSIN HCL 0.4 MG PO CAPS: 0.8000 mg | ORAL_CAPSULE | Freq: Every day | ORAL | 11 refills | Status: DC

## 2023-07-07 NOTE — Progress Notes (Unsigned)
Subjective:  CC: establish care  HPI:  [name] is a 52 y.o. male with a past medical history stated below and presents today for establish care after getting insurance. He had many concerns today and is happy to have a PCP. We selected top issues to discuss today and plan for close follow-up to discuss remaining concerns.   Please see problem based assessment and plan for additional details.  Past Medical History:  Diagnosis Date   Depression    Hypertension    Parkinson's disease (HCC)    Sciatica    Tourette's     Current Outpatient Medications on File Prior to Visit  Medication Sig Dispense Refill   amLODipine (NORVASC) 10 MG tablet Take 10 mg by mouth daily.     benztropine (COGENTIN) 2 MG tablet Take 2 mg by mouth daily.     escitalopram (LEXAPRO) 20 MG tablet Take 20 mg by mouth daily.     gabapentin (NEURONTIN) 300 MG capsule Take 300 mg by mouth 3 (three) times daily.     haloperidol (HALDOL) 10 MG tablet Take 10 mg by mouth 2 (two) times daily.     lisinopril (ZESTRIL) 40 MG tablet Take 40 mg by mouth daily.     loratadine (CLARITIN) 10 MG tablet Take 10 mg by mouth daily.     omeprazole (PRILOSEC) 20 MG capsule Take 20 mg by mouth daily.     No current facility-administered medications on file prior to visit.    Family History  Problem Relation Age of Onset   Multiple sclerosis Father     Social History   Socioeconomic History   Marital status: Single    Spouse name: Not on file   Number of children: Not on file   Years of education: Not on file   Highest education level: Not on file  Occupational History   Not on file  Tobacco Use   Smoking status: Every Day    Types: Cigarettes, Cigars    Passive exposure: Current   Smokeless tobacco: Never  Vaping Use   Vaping status: Never Used  Substance and Sexual Activity   Alcohol use: Yes    Alcohol/week: 50.0 standard drinks of alcohol    Types: 50 Cans of beer per week    Comment:  social    Drug use: Yes    Types: Marijuana   Sexual activity: Yes  Other Topics Concern   Not on file  Social History Narrative   Not on file   Social Determinants of Health   Financial Resource Strain: Not on file  Food Insecurity: No Food Insecurity (07/07/2023)   Hunger Vital Sign    Worried About Running Out of Food in the Last Year: Never true    Ran Out of Food in the Last Year: Never true  Transportation Needs: No Transportation Needs (07/07/2023)   PRAPARE - Administrator, Civil Service (Medical): No    Lack of Transportation (Non-Medical): No  Physical Activity: Not on file  Stress: Not on file  Social Connections: Not on file  Intimate Partner Violence: Not on file    Review of Systems: ROS negative except for what is noted on the assessment and plan.  Objective:   Vitals:   07/07/23 1509 07/07/23 1612  BP: (!) 138/93 (!) 145/101  Pulse: (!) 109 97  Temp: 98.5 F (36.9 C)   TempSrc: Oral   SpO2: 99%   Weight: 200 lb 4.8 oz (90.9 kg)  Physical Exam: Constitutional: well-appearing  Cardiovascular: regular rate and rhythm, no m/r/g Pulmonary/Chest: normal work of breathing on room air, lungs clear to auscultation bilaterally MSK: - Palpation: TTP over cervical paraspinal muscles  - ROM: decreased cervical extension, full ROM in cervical flexion - Strength: left arm with 4/5 strength in flexion and abduction, effort fluctuates - Neuro/vasc: radial pulses 2 bilaterally - Special Tests: spurlings + to left Neurological: alert & oriented x 3 Skin: warm and dry Psych: speech pressured at times     Assessment & Plan:  Lower urinary tract symptoms (LUTS) IPSS of >35. He has difficulty starting a stream, emptying bladder and with nocturia. CT abd/pelvis from 2023 showed prostomegaly and bladder wall thickening. He denies dysuria. He reports that 1-2 times monthly he passes a large clot in his stream. Prior CT stone study showed no  nephrolothiasis. Recent UA hgb+. PVR 180cc A: High concern for BPH versus prostate metaplasia P: PSA of 1.3 UA without signs of infection, shows Hgb+ and RBC+ No casts on UA Referral to urology placed Increase tamsulosin to 0.8 mg qd   AKI (acute kidney injury) (HCC) Recent creatinine elevated in ED at 1.9. He endorse obstructive symptoms and is taking ibuprofen 800mg  daily for cervical radiculopathy. A: AKI concerning for pre renal versus post renal etiology. P: Counseled patient to stop ibuprofen PVR with 180cc less concern for obstruction Repeat BMP with creatinine improved to 1.2.  Cervical radiculopathy Patient with several month history of left arm numbness and pain. MRI completed in ED 8/24 shoed moderate neural foraminal stenosis and mild spinal canal stenosis. Exam with mild weakness in left upper extremity P: Referral to PT   Tourette's syndrome Home medications include cogentin, haldol, and lexapro. He followed with therapist at [facility] in the past and [facility] He was getting RX from health department but does not have a psychiatrist. P: Referral to psychiatrist  Healthcare maintenance HIV and hepatitis C negative  At risk alcohol consumption He drinks 6 beers daily. In the past he went to rehab prior to COVID, since then he reports that his drinking has decreased a lot. He does not have withdrawal symptoms. P: Is not interested in decreasing at this time. Would delve into this further if he is open to it. Time limited at this visit.  Tobacco use Revisit at follow-up  Hypertension Medications include amlodipine 10 mg, lisinopril 40 mg and hydrochlorothiazide 25 mg. BP elevated at 145/101 on repeat. His mom states that systolic >140 at home too. I am concerned that thiazide is contributing to LUTS.  P: Stop hydrochlorothiazide Continue amlodipine 10 and lisinopril 40 Start coreg 6.25 mg BID   Patient discussed with Dr.  [name], D.O. [facility]  PGY-3 Pager: [phone]  Phone: [phone] Date 07/08/2023  Time 5:57 PM

## 2023-07-07 NOTE — Patient Instructions (Signed)
Thank you, Mr.Mir T Julius for allowing [place] to provide your care today. Today we discussed:    Urinary retention I believe you have benign prostatic hyperplasia, or BPH, which is contributing to your urinary difficulties. You will also be tested for your PSA levels, which will be elevated in conditions like BPH. I have placed a referral to Urology to look into the extent and management of this condition. I have also sent a new prescription for FloMax 0.8 mg.   High blood pressure You are on a few medications for high blood pressure, including amlodipine, lisinopril, and hydrochlorothiazide (HCTZ). Please STOP taking the HCTZ, as it might be making your urinary symptoms worse. Please come back in 1 week for a blood pressure check and we will see if we need to start another medication to help keep your blood pressure down. Please call tomorrow to set up that appointment.  Kidneys Your urinalysis today showed no evidence of an infection at this time. I have sent the sample for further studies and will let you know what the results are.   Arm pain The tingling and burning in your arm is likely a nerve becoming pinched in your arm. I have placed a referral to physical therapy, which can help to relieve these symptoms. Please STOP taking ibuprofen in the meantime, as it is likely contributing to your kidney injury.   Tourette's You have previously been taking a medication called haloperidol, which should be managed by a psychiatrist. I have placed a referral for you.  I have ordered the following labs for you:  Lab Orders         BMP8+Anion Gap         Urinalysis, dipstick only         Urinalysis, Reflex Microscopic         HIV antibody (with reflex)         Hepatitis C antibody, reflex      Referrals ordered today:   Referral Orders         Ambulatory referral to Urology         Ambulatory referral to Physical Therapy      I have ordered the following medication/changed the  following medications:   Stop the following medications: Medications Discontinued During This Encounter  Medication Reason   methylPREDNISolone (MEDROL DOSEPAK) 4 MG TBPK tablet    ibuprofen (ADVIL) 200 MG tablet    HYDROcodone-acetaminophen (NORCO/VICODIN) 5-325 MG tablet    hydrochlorothiazide (HYDRODIURIL) 25 MG tablet      Start the following medications: No orders of the defined types were placed in this encounter.    Follow up:  2-3 weeks , nurse-only visit in 1 week  Remember: Please call tomorrow to set up these appointments  We look forward to seeing you next time. Please call our clinic at [phone] if you have any questions or concerns. The best time to call is Monday-Friday from 9am-4pm, but there is someone available 24/7. If after hours or the weekend, call the main hospital number and ask for the Internal Medicine Resident On-Call. If you need medication refills, please notify your pharmacy one week in advance and they will send [place] a request.   Thank you for trusting me with your care. Wishing you the best!   Rudene Christians, DO [facility]

## 2023-07-08 DIAGNOSIS — I10 Essential (primary) hypertension: Secondary | ICD-10-CM | POA: Insufficient documentation

## 2023-07-08 DIAGNOSIS — Z720 Tobacco use: Secondary | ICD-10-CM | POA: Insufficient documentation

## 2023-07-08 DIAGNOSIS — Z0000 Encounter for general adult medical examination without abnormal findings: Secondary | ICD-10-CM | POA: Insufficient documentation

## 2023-07-08 DIAGNOSIS — Z9189 Other specified personal risk factors, not elsewhere classified: Secondary | ICD-10-CM | POA: Insufficient documentation

## 2023-07-08 LAB — PSA: Prostate Specific Ag, Serum: 1.3 ng/mL (ref 0.0–4.0)

## 2023-07-08 LAB — URINALYSIS, ROUTINE W REFLEX MICROSCOPIC
Bilirubin, UA: NEGATIVE
Glucose, UA: NEGATIVE
Ketones, UA: NEGATIVE
Leukocytes,UA: NEGATIVE
Nitrite, UA: NEGATIVE
Protein,UA: NEGATIVE
Specific Gravity, UA: 1.008 (ref 1.005–1.030)
Urobilinogen, Ur: 0.2 mg/dL (ref 0.2–1.0)
pH, UA: 5.5 (ref 5.0–7.5)

## 2023-07-08 LAB — HCV AB W REFLEX TO QUANT PCR: HCV Ab: NONREACTIVE

## 2023-07-08 LAB — BMP8+ANION GAP
Anion Gap: 17 mmol/L (ref 10.0–18.0)
BUN/Creatinine Ratio: 12 (ref 9–20)
BUN: 15 mg/dL (ref 6–24)
CO2: 18 mmol/L — ABNORMAL LOW (ref 20–29)
Calcium: 8.9 mg/dL (ref 8.7–10.2)
Chloride: 103 mmol/L (ref 96–106)
Creatinine, Ser: 1.29 mg/dL — ABNORMAL HIGH (ref 0.76–1.27)
Glucose: 74 mg/dL (ref 70–99)
Potassium: 4.4 mmol/L (ref 3.5–5.2)
Sodium: 138 mmol/L (ref 134–144)
eGFR: 66 mL/min/{1.73_m2} (ref >59)

## 2023-07-08 LAB — MICROSCOPIC EXAMINATION
Bacteria, UA: NONE SEEN
Casts: NONE SEEN /[LPF]
Epithelial Cells (non renal): NONE SEEN /[HPF] (ref 0–10)

## 2023-07-08 LAB — HCV INTERPRETATION

## 2023-07-08 LAB — HIV ANTIBODY (ROUTINE TESTING W REFLEX): HIV Screen 4th Generation wRfx: NONREACTIVE

## 2023-07-08 NOTE — Assessment & Plan Note (Signed)
HIV and hepatitis C negative

## 2023-07-08 NOTE — Assessment & Plan Note (Signed)
Patient with several month history of left arm numbness and pain. MRI completed in ED 8/24 shoed moderate neural foraminal stenosis and mild spinal canal stenosis. Exam with mild weakness in left upper extremity P: Referral to PT

## 2023-07-08 NOTE — Assessment & Plan Note (Signed)
Medications include amlodipine 10 mg, lisinopril 40 mg and hydrochlorothiazide 25 mg. BP elevated at 145/101 on repeat. His mom states that systolic >140 at home too. I am concerned that thiazide is contributing to LUTS.  P: Stop hydrochlorothiazide Continue amlodipine 10 and lisinopril 40 Start coreg 6.25 mg BID

## 2023-07-08 NOTE — Assessment & Plan Note (Signed)
Revisit at follow-up

## 2023-07-08 NOTE — Assessment & Plan Note (Signed)
He drinks 6 beers daily. In the past he went to rehab prior to COVID, since then he reports that his drinking has decreased a lot. He does not have withdrawal symptoms. P: Is not interested in decreasing at this time. Would delve into this further if he is open to it. Time limited at this visit.

## 2023-07-08 NOTE — Assessment & Plan Note (Addendum)
IPSS of >35. He has difficulty starting a stream, emptying bladder and with nocturia. CT abd/pelvis from 2023 showed prostomegaly and bladder wall thickening. He denies dysuria. He reports that 1-2 times monthly he passes a large clot in his stream. Prior CT stone study showed no nephrolothiasis. Recent UA hgb+. PVR 180cc A: High concern for BPH versus prostate metaplasia P: PSA of 1.3 UA without signs of infection, shows Hgb+ and RBC+ No casts on UA Referral to urology placed Increase tamsulosin to 0.8 mg qd

## 2023-07-08 NOTE — Assessment & Plan Note (Signed)
Recent creatinine elevated in ED at 1.9. He endorse obstructive symptoms and is taking ibuprofen 800mg  daily for cervical radiculopathy. A: AKI concerning for pre renal versus post renal etiology. P: Counseled patient to stop ibuprofen PVR with 180cc less concern for obstruction Repeat BMP with creatinine improved to 1.2.

## 2023-07-08 NOTE — Assessment & Plan Note (Signed)
Home medications include cogentin, haldol, and lexapro. He followed with therapist at [facility] in the past and [facility] He was getting RX from health department but does not have a psychiatrist. P: Referral to psychiatrist

## 2023-07-12 NOTE — Progress Notes (Signed)
Internal Medicine Clinic Attending  Case discussed with the resident at the time of the visit.  We reviewed the resident's history and exam and pertinent patient test results.  I agree with the assessment, diagnosis, and plan of care documented in the resident's note.  

## 2023-07-22 DIAGNOSIS — F429 Obsessive-compulsive disorder, unspecified: Secondary | ICD-10-CM | POA: Diagnosis not present

## 2023-08-02 DIAGNOSIS — Z419 Encounter for procedure for purposes other than remedying health state, unspecified: Secondary | ICD-10-CM | POA: Diagnosis not present

## 2023-08-06 ENCOUNTER — Encounter: Payer: Self-pay | Admitting: Student

## 2023-08-06 ENCOUNTER — Ambulatory Visit: Payer: Medicaid Other | Admitting: Student

## 2023-08-06 VITALS — BP 139/91 | HR 72 | Temp 98.1°F | Ht 75.0 in | Wt 199.8 lb

## 2023-08-06 DIAGNOSIS — R399 Unspecified symptoms and signs involving the genitourinary system: Secondary | ICD-10-CM

## 2023-08-06 DIAGNOSIS — F17210 Nicotine dependence, cigarettes, uncomplicated: Secondary | ICD-10-CM | POA: Diagnosis not present

## 2023-08-06 DIAGNOSIS — F32A Depression, unspecified: Secondary | ICD-10-CM | POA: Diagnosis not present

## 2023-08-06 DIAGNOSIS — F952 Tourette's disorder: Secondary | ICD-10-CM | POA: Diagnosis not present

## 2023-08-06 DIAGNOSIS — I10 Essential (primary) hypertension: Secondary | ICD-10-CM | POA: Diagnosis not present

## 2023-08-06 DIAGNOSIS — Z720 Tobacco use: Secondary | ICD-10-CM

## 2023-08-06 DIAGNOSIS — F339 Major depressive disorder, recurrent, unspecified: Secondary | ICD-10-CM

## 2023-08-06 MED ORDER — VARENICLINE TARTRATE 1 MG PO TABS: 1.0000 mg | ORAL_TABLET | Freq: Two times a day (BID) | ORAL | 0 refills | Status: DC

## 2023-08-06 MED ORDER — HALOPERIDOL 10 MG PO TABS: 10.0000 mg | ORAL_TABLET | Freq: Two times a day (BID) | ORAL | 1 refills | Status: DC

## 2023-08-06 MED ORDER — AMLODIPINE-OLMESARTAN 10-20 MG PO TABS: 1.0000 | ORAL_TABLET | Freq: Every day | ORAL | 6 refills | Status: DC

## 2023-08-06 NOTE — Assessment & Plan Note (Addendum)
Patient has a history of hypertension.  Repeat blood pressure in the office today is 139/91.  Patient reported he has not taking his blood pressure medication today because he had run out.  Last BMP was unremarkable except for creatinine of 1.29.  I will consider switching patient to olmesartan amlodipine combo at this time and follow-up with a BMP in 4 weeks. -Prescribe Azor 10-20 mg tablets -Follow-up in 4 weeks for BMP

## 2023-08-06 NOTE — Assessment & Plan Note (Signed)
Patient has a history of urinary retention currently being managed with Flomax.  Patient continued to endorse urinary urge symptoms which I suspect could be contributing to his recent acute kidney injury.  Will consider prescribing Flomax at this time and reassess the patient in 4 weeks.  If symptoms continue to persist, I will consider referring to urology for further evaluation.. -Continue Flomax 0.4 mg CAPS

## 2023-08-06 NOTE — Progress Notes (Signed)
CC: Medication refill  HPI:  Jake Smith is a 52 y.o. male living with a history stated below and presents today for medication refill and also discuss other chronic conditions.. Please see problem based assessment and plan for additional details.  Past Medical History:  Diagnosis Date   Depression    Hypertension    Parkinson's disease (HCC)    Sciatica    Tourette's     Current Outpatient Medications on File Prior to Visit  Medication Sig Dispense Refill   benztropine (COGENTIN) 2 MG tablet Take 2 mg by mouth daily.     carvedilol (COREG) 6.25 MG tablet Take 1 tablet (6.25 mg total) by mouth 2 (two) times daily. 60 tablet 11   escitalopram (LEXAPRO) 20 MG tablet Take 20 mg by mouth daily.     gabapentin (NEURONTIN) 300 MG capsule Take 300 mg by mouth 3 (three) times daily.     lisinopril (ZESTRIL) 40 MG tablet Take 40 mg by mouth daily.     loratadine (CLARITIN) 10 MG tablet Take 10 mg by mouth daily.     omeprazole (PRILOSEC) 20 MG capsule Take 20 mg by mouth daily.     tamsulosin (FLOMAX) 0.4 MG CAPS capsule Take 2 capsules (0.8 mg total) by mouth daily after supper. 30 capsule 11   No current facility-administered medications on file prior to visit.    Family History  Problem Relation Age of Onset   Multiple sclerosis Father     Social History   Socioeconomic History   Marital status: Single    Spouse name: Not on file   Number of children: Not on file   Years of education: Not on file   Highest education level: Not on file  Occupational History   Not on file  Tobacco Use   Smoking status: Every Day    Types: Cigarettes, Cigars    Passive exposure: Current   Smokeless tobacco: Never  Vaping Use   Vaping status: Never Used  Substance and Sexual Activity   Alcohol use: Yes    Alcohol/week: 50.0 standard drinks of alcohol    Types: 50 Cans of beer per week    Comment: social    Drug use: Yes    Types: Marijuana   Sexual activity: Yes  Other  Topics Concern   Not on file  Social History Narrative   Not on file   Social Determinants of Health   Financial Resource Strain: Not on file  Food Insecurity: No Food Insecurity (07/07/2023)   Hunger Vital Sign    Worried About Running Out of Food in the Last Year: Never true    Ran Out of Food in the Last Year: Never true  Transportation Needs: No Transportation Needs (07/07/2023)   PRAPARE - Administrator, Civil Service (Medical): No    Lack of Transportation (Non-Medical): No  Physical Activity: Not on file  Stress: Not on file  Social Connections: Not on file  Intimate Partner Violence: Not on file    Review of Systems: ROS negative except for what is noted on the assessment and plan.  Vitals:   08/06/23 0839 08/06/23 0914  BP: (!) 148/92 (!) 139/91  Pulse: 81 72  Temp: 98.1 F (36.7 C)   TempSrc: Oral   SpO2: 100%   Weight: 199 lb 12.8 oz (90.6 kg)   Height: 6\' 3"  (1.905 m)     Physical Exam: Constitutional: Well-appearing man, sitting in the chair in no acute distress  Cardiovascular: regular rate and rhythm, no m/r/g Pulmonary/Chest: normal work of breathing on room air, lungs clear to auscultation bilaterally MSK: normal bulk and tone Neurological: alert & oriented x 3, no focal deficit Skin: warm and dry Psych: Pressured but goal oriented speech  Assessment & Plan:   Hypertension Patient has a history of hypertension.  Repeat blood pressure in the office today is 139/91.  Patient reported he has not taking his blood pressure medication today because he had run out.  Last BMP was unremarkable except for creatinine of 1.29.  I will consider switching patient to olmesartan amlodipine combo at this time and follow-up with a BMP in 4 weeks. -Prescribe Azor 10-20 mg tablets -Follow-up in 4 weeks for BMP  Depression History of depression currently being managed with Lexapro, previously used to see psychiatrist but stopped due to insurance relapse.  A  new psychiatry referral is in process at this time -Continue Lexapro -Follow-up on psychiatry referral  Tobacco use Patient has a history of tobacco use disorder.  He used to smoke 1 pack a day but reports he is cut down to half a pack a day.  Patient is amenable to trying Chantix at this time but unsure of quit date. -Prescribe starter pack of Chantix -Continue to offer smoking cessation counseling at next visit  Lower urinary tract symptoms (LUTS) Patient has a history of urinary retention currently being managed with Flomax.  Patient continued to endorse urinary urge symptoms which I suspect could be contributing to his recent acute kidney injury.  Will consider prescribing Flomax at this time and reassess the patient in 4 weeks.  If symptoms continue to persist, I will consider referring to urology for further evaluation.. -Continue Flomax 0.4 mg CAPS   Tourette's syndrome Patient' Tourette's syndrome is currently being managed with Haldol.  He used  to follow-up with a therapist in the past but got discontinued due to insurance relapse.  Will prescribe a short course of Haldol and follow-up with psychiatry referral to continue management -Prescribe 1 month course of Haldol 10 mg   Patient seen with Dr. Ginger Carne, M.D [facility] Phone: [phone] Date 08/06/2023 Time 10:02 AM

## 2023-08-06 NOTE — Assessment & Plan Note (Signed)
Patient has a history of tobacco use disorder.  He used to smoke 1 pack a day but reports he is cut down to half a pack a day.  Patient is amenable to trying Chantix at this time but unsure of quit date. -Prescribe starter pack of Chantix -Continue to offer smoking cessation counseling at next visit

## 2023-08-06 NOTE — Assessment & Plan Note (Addendum)
Patient' Tourette's syndrome is currently being managed with Haldol.  He used  to follow-up with a therapist in the past but got discontinued due to insurance relapse.  Will prescribe a short course of Haldol and follow-up with psychiatry referral to continue management -Prescribe 1 month course of Haldol 10 mg

## 2023-08-06 NOTE — Assessment & Plan Note (Addendum)
History of depression currently being managed with Lexapro, previously used to see psychiatrist but stopped due to insurance relapse.  A new psychiatry referral is in process at this time -Continue Lexapro -Follow-up on psychiatry referral

## 2023-08-06 NOTE — Patient Instructions (Addendum)
Thank you, [name] for allowing [place] to provide your care today. Today we discussed general health.   We spoke about your blood pressure, depression/anxiety/panic attack and also discussed your tobacco use. -Blood pressure continues to be elevated in the office today.  I am changing your blood pressure medication to olmesartan-amlodipine combo pill.  Please take 1 pill a day. -Follow depression and anxiety and panic attacks I am refilling your Lexapro today.  Because Haldol is a medication that we do not primarily prescribe in the clinic, I would send you a 60-day supply while we wait for psychology to see you.  I have ordered the following labs for you:  Lab Orders  No laboratory test(s) ordered today     Tests ordered today:    Referrals ordered today:   Referral Orders  No referral(s) requested today     I have ordered the following medication/changed the following medications:   Stop the following medications: Medications Discontinued During This Encounter  Medication Reason   haloperidol (HALDOL) 10 MG tablet Reorder   amLODipine (NORVASC) 10 MG tablet Dose change     Start the following medications: Meds ordered this encounter  Medications   amlodipine-olmesartan (AZOR) 10-20 MG tablet    Sig: Take 1 tablet by mouth daily.    Dispense:  30 tablet    Refill:  6   varenicline (CHANTIX CONTINUING MONTH PAK) 1 MG tablet    Sig: Take 1 tablet (1 mg total) by mouth 2 (two) times daily.    Dispense:  40 tablet    Refill:  0   haloperidol (HALDOL) 10 MG tablet    Sig: Take 1 tablet (10 mg total) by mouth 2 (two) times daily.    Dispense:  30 tablet    Refill:  1     Follow up:  4 weeks    Remember:   Should you have any questions or concerns please call the internal medicine clinic at [phone].    Kathleen Lime, M.D [facility]

## 2023-08-10 ENCOUNTER — Telehealth (HOSPITAL_COMMUNITY): Payer: Self-pay

## 2023-08-10 NOTE — Telephone Encounter (Signed)
While speaking with PT regarding a referral , I got PT scheduled for Therapy - at the end of Conversation I told PT Okay you are all set for Jan, We will call you to confirm the appointment a few days prior to appointment happening. If we do not hear a confirmation the appointment will be cancelled due to non-confirmation. The PT and a male then asked excuse me? And I repeated the confirmation cancellation, the male proceeded to say that is very unprofessional of you and the PT agreed and then hung up.

## 2023-08-11 NOTE — Progress Notes (Signed)
Internal Medicine Clinic Attending  I was physically present during the key portions of the resident provided service and participated in the medical decision making of patient's management care. I reviewed pertinent patient test results.  The assessment, diagnosis, and plan were formulated together and I agree with the documentation in the resident's note.  Reymundo Poll, MD

## 2023-08-12 DIAGNOSIS — F429 Obsessive-compulsive disorder, unspecified: Secondary | ICD-10-CM | POA: Diagnosis not present

## 2023-08-12 NOTE — Addendum Note (Signed)
Addended by: Bufford Spikes on: 08/12/2023 03:38 PM   Modules accepted: Orders

## 2023-08-17 ENCOUNTER — Ambulatory Visit: Payer: Medicaid Other | Admitting: Physical Therapy

## 2023-08-17 NOTE — Therapy (Incomplete)
OUTPATIENT PHYSICAL THERAPY CERVICAL EVALUATION   Patient Name: Jake Smith MRN: [id number] DOB:13-Mar-1971, 52 y.o., male Today's Date: 08/17/2023  END OF SESSION:   Past Medical History:  Diagnosis Date   Depression    Hypertension    Parkinson's disease (HCC)    Sciatica    Tourette's    Past Surgical History:  Procedure Laterality Date   HERNIA REPAIR     Patient Active Problem List   Diagnosis Date Noted   Depression 08/06/2023   Healthcare maintenance 07/08/2023   At risk alcohol consumption 07/08/2023   Tobacco use 07/08/2023   Hypertension 07/08/2023   Lower urinary tract symptoms (LUTS) 07/07/2023   Tourette's syndrome 07/07/2023   AKI (acute kidney injury) (HCC) 07/07/2023   Cervical radiculopathy 07/07/2023    PCP: Kathleen Lime, MD  REFERRING PROVIDER: Tyson Alias, MD  REFERRING DIAG: [phone] (ICD-10-CM) - Cervical radiculopathy  THERAPY DIAG:  No diagnosis found.  Rationale for Evaluation and Treatment: Rehabilitation  ONSET DATE: ***  SUBJECTIVE:                                                                                                                                                                                                         SUBJECTIVE STATEMENT: *** Hand dominance: {MISC; OT HAND [phone]}  PERTINENT HISTORY:  depression ,HTN, parkinson's, tourettes  PAIN:  Are you having pain: *** Location/description: *** Best-worst over past week: ***  - aggravating factors: *** - Easing factors: ***    PRECAUTIONS: None   WEIGHT BEARING RESTRICTIONS: No  FALLS:  Has patient fallen in last 6 months? {fallsyesno:27318}  LIVING ENVIRONMENT: Lives with: {[facility] lives with:25569::"lives with their family"} Lives in: {Lives in:25570} Stairs: {opstairs:27293} Has following equipment at home: {Assistive devices:23999}  OCCUPATION: ***  PLOF: {PLOF:24004}  PATIENT GOALS: ***  NEXT MD VISIT:  ***  OBJECTIVE:  Note: Objective measures were completed at Evaluation unless otherwise noted.  DIAGNOSTIC FINDINGS:  04/28/23 Cervical MRI: "IMPRESSION: 1. Moderate left C2-3, left C4-5 and right C7-T1 neural foraminal stenosis. 2. Mild spinal canal stenosis at C4-5, C5-6 and C6-7."  PATIENT SURVEYS:  FOTO ***  COGNITION: Overall cognitive status: Within functional limits for tasks assessed  SENSATION: {sensation:27233}  POSTURE: {posture:25561}  PALPATION: ***   CERVICAL ROM:   {AROM/PROM:27142} ROM A/PROM (deg) eval  Flexion   Extension   Right lateral flexion   Left lateral flexion   Right rotation   Left rotation    (Blank rows = not tested) (Key: WFL = within functional limits not formally assessed, * = concordant  pain, s = stiffness/stretching sensation, NT = not tested)   UPPER EXTREMITY ROM:  A/PROM Right eval Left eval  Shoulder flexion    Shoulder abduction    Shoulder internal rotation    Shoulder external rotation    Elbow flexion    Elbow extension    Wrist flexion    Wrist extension     (Blank rows = not tested) (Key: WFL = within functional limits not formally assessed, * = concordant pain, s = stiffness/stretching sensation, NT = not tested)  Comments:    UPPER EXTREMITY MMT:  MMT Right eval Left eval  Shoulder flexion    Shoulder extension    Shoulder abduction    Shoulder extension    Shoulder internal rotation    Shoulder external rotation    Elbow flexion    Elbow extension    Grip strength    (Blank rows = not tested)  (Key: WFL = within functional limits not formally assessed, * = concordant pain, s = stiffness/stretching sensation, NT = not tested)  Comments:   CERVICAL SPECIAL TESTS:  {Cervical special tests:25246}  FUNCTIONAL TESTS:  {Functional tests:24029}  TODAY'S TREATMENT:                                                                                                                              [facility] Adult  PT Treatment:                                                DATE: 08/17/23 Therapeutic Exercise: *** Manual Therapy: *** Neuromuscular re-ed: *** Therapeutic Activity: *** Modalities: *** Self Care: ***   PATIENT EDUCATION:  Education details: Pt education on PT impairments, prognosis, and POC. Informed consent. Rationale for interventions, safe/appropriate HEP performance Person educated: Patient Education method: Explanation, Demonstration, Tactile cues, Verbal cues, and Handouts Education comprehension: verbalized understanding, returned demonstration, verbal cues required, tactile cues required, and needs further education    HOME EXERCISE PROGRAM: ***  ASSESSMENT:  CLINICAL IMPRESSION: Patient is a 52 y.o. gentleman who was seen today for physical therapy evaluation and treatment with referral for cervical radiculopathy. ***    OBJECTIVE IMPAIRMENTS: {opptimpairments:25111}.   ACTIVITY LIMITATIONS: {activitylimitations:27494}  PARTICIPATION LIMITATIONS: {participationrestrictions:25113}  PERSONAL FACTORS: Time since onset of injury/illness/exacerbation and 3+ comorbidities: depression, HTN, parkinson's, tourettes  are also affecting patient's functional outcome.   REHAB POTENTIAL: {rehabpotential:25112}  CLINICAL DECISION MAKING: {clinical decision making:25114}  EVALUATION COMPLEXITY: {Evaluation complexity:25115}   GOALS: Goals reviewed with patient? {yes/no:20286}  SHORT TERM GOALS: Target date: ***  Pt will demonstrate appropriate understanding and performance of initially prescribed HEP in order to facilitate improved independence with management of symptoms.  Baseline: HEP provided on eval Goal status: INITIAL   2. Pt will score greater than or equal to *** on FOTO in order to demonstrate improved  perception of function due to symptoms.  Baseline: ***  Goal status: INITIAL    LONG TERM GOALS: Target date: ***  Pt will score *** on FOTO in order to  demonstrate improved perception of function due to symptoms. Baseline: *** Goal status: INITIAL  2. Pt will demonstrate at least *** degrees of active cervical rotation ROM in order to demonstrate improved environmental awareness and safety with driving.  Baseline: see ROM chart above Goal status: INITIAL  3. Pt will demonstrate at least 4+/5 shoulder MMT for improved symmetry of UE strength and improved tolerance to functional movements.  Baseline: see MMT chart above Goal status: INITIAL   4. Pt will report/demonstrate ability to *** with less than 2 point increase on NPS in order to demonstrate improved tolerance to functional activities such as ***. Baseline: *** Goal status: INITIAL    PLAN:  PT FREQUENCY: {rehab frequency:25116}  PT DURATION: {rehab duration:25117}  PLANNED INTERVENTIONS: {rehab planned interventions:25118::"97110-Therapeutic exercises","97530- Therapeutic [phone]- Neuromuscular re-education","97535- Self NWGN","56213- Manual therapy"}  PLAN FOR NEXT SESSION: Review/update HEP PRN. Work on [employer] exercises as appropriate with emphasis on ***. Symptom modification strategies as indicated/appropriate.   Ashley Murrain PT, DPT 08/17/2023 7:46 AM

## 2023-09-07 ENCOUNTER — Ambulatory Visit: Payer: No Typology Code available for payment source

## 2023-09-14 ENCOUNTER — Ambulatory Visit: Payer: No Typology Code available for payment source | Admitting: Physical Therapy

## 2023-09-18 ENCOUNTER — Ambulatory Visit (HOSPITAL_COMMUNITY): Payer: Self-pay | Admitting: Licensed Clinical Social Worker

## 2023-09-20 ENCOUNTER — Encounter (HOSPITAL_COMMUNITY): Payer: Self-pay

## 2023-09-20 ENCOUNTER — Emergency Department (HOSPITAL_COMMUNITY)
Admission: EM | Admit: 2023-09-20 | Discharge: 2023-09-21 | Disposition: A | Discharge status: Home / Self Care | Payer: Medicaid Other | Attending: Emergency Medicine | Admitting: Emergency Medicine

## 2023-09-20 ENCOUNTER — Emergency Department (HOSPITAL_COMMUNITY): Payer: Medicaid Other

## 2023-09-20 DIAGNOSIS — I10 Essential (primary) hypertension: Secondary | ICD-10-CM | POA: Diagnosis not present

## 2023-09-20 DIAGNOSIS — R319 Hematuria, unspecified: Secondary | ICD-10-CM | POA: Diagnosis present

## 2023-09-20 LAB — CBC
HCT: 50 % (ref 39.0–52.0)
Hemoglobin: 16.2 g/dL (ref 13.0–17.0)
MCH: 28.9 pg (ref 26.0–34.0)
MCHC: 32.4 g/dL (ref 30.0–36.0)
MCV: 89.1 fL (ref 80.0–100.0)
Platelets: 290 10*3/uL (ref 150–400)
RBC: 5.61 MIL/uL (ref 4.22–5.81)
RDW: 14.4 % (ref 11.5–15.5)
WBC: 11.1 10*3/uL — ABNORMAL HIGH (ref 4.0–10.5)
nRBC: 0 % (ref 0.0–0.2)

## 2023-09-20 LAB — BASIC METABOLIC PANEL
Anion gap: 11 (ref 5–15)
BUN: 31 mg/dL — ABNORMAL HIGH (ref 6–20)
CO2: 21 mmol/L — ABNORMAL LOW (ref 22–32)
Calcium: 8.8 mg/dL — ABNORMAL LOW (ref 8.9–10.3)
Chloride: 97 mmol/L — ABNORMAL LOW (ref 98–111)
Creatinine, Ser: 1.58 mg/dL — ABNORMAL HIGH (ref 0.61–1.24)
GFR, Estimated: 52 mL/min — ABNORMAL LOW (ref >60)
Glucose, Bld: 95 mg/dL (ref 70–99)
Potassium: 5.1 mmol/L (ref 3.5–5.1)
Sodium: 129 mmol/L — ABNORMAL LOW (ref 135–145)

## 2023-09-20 NOTE — ED Notes (Signed)
Patient transported to CT and returned to room.

## 2023-09-20 NOTE — ED Triage Notes (Signed)
Patient arrived stating that he has some blood in his urine and generalized abdominal pain and leg/back cramps. Also complaints of not fully emptying his bladder.

## 2023-09-20 NOTE — ED Provider Notes (Signed)
[place] EMERGENCY DEPARTMENT AT [facility] Provider Note   CSN: [id number] Arrival date & time: 09/20/23  2142     History {Add pertinent medical, surgical, social history, OB history to HPI:1} Chief Complaint  Patient presents with   Hematuria    MAXIMUS CHERNOW is a 53 y.o. male.  The history is provided by the patient and medical records.  Hematuria   53 year old male with history of depression, hypertension, Tourette's, presenting to the ED with hematuria.  States he has had this issue before but seems worse now.  He reports he does have blood in the urine but is also having a lot of difficulty initiating his urinary stream.  He does not feel he empties his bladder all the way so ends up voiding 3-4 times in an hour.  He was seen for this last year and referred to urology but did not have insurance at the time so did not have formal follow-up.  He is currently on Flomax but does not feel like it is helping that much.  He denies any fever or chills.  No nausea or vomiting.  Prior to arrival he was having some pain on his right flank, seems to have calm down now.  Home Medications Prior to Admission medications   Medication Sig Start Date End Date Taking? Authorizing Provider  amlodipine-olmesartan (AZOR) 10-20 MG tablet Take 1 tablet by mouth daily. 08/06/23   Kathleen Lime, MD  benztropine (COGENTIN) 2 MG tablet Take 2 mg by mouth daily.    [provider]  carvedilol (COREG) 6.25 MG tablet Take 1 tablet (6.25 mg total) by mouth 2 (two) times daily. 07/07/23 07/06/24  Masters, Katie, DO  escitalopram (LEXAPRO) 20 MG tablet Take 20 mg by mouth daily.    [provider]  gabapentin (NEURONTIN) 300 MG capsule Take 300 mg by mouth 3 (three) times daily.    [provider]  haloperidol (HALDOL) 10 MG tablet Take 1 tablet (10 mg total) by mouth 2 (two) times daily. 08/06/23   Kathleen Lime, MD  lisinopril (ZESTRIL) 40 MG tablet Take 40 mg by  mouth daily.    [provider]  loratadine (CLARITIN) 10 MG tablet Take 10 mg by mouth daily.    [provider]  omeprazole (PRILOSEC) 20 MG capsule Take 20 mg by mouth daily.    [provider]  tamsulosin (FLOMAX) 0.4 MG CAPS capsule Take 2 capsules (0.8 mg total) by mouth daily after supper. 07/07/23   Masters, Florentina Addison, DO  varenicline (CHANTIX CONTINUING MONTH PAK) 1 MG tablet Take 1 tablet (1 mg total) by mouth 2 (two) times daily. 08/06/23   Kathleen Lime, MD      Allergies    Egg-derived products, Shellfish allergy, and Tylenol [acetaminophen]    Review of Systems   Review of Systems  Genitourinary:  Positive for hematuria.  All other systems reviewed and are negative.   Physical Exam Updated Vital Signs BP (!) 121/109   Pulse 93   Temp 98.5 F (36.9 C) (Oral)   Resp 18   Ht 6\' 3"  (1.905 m)   Wt 89.4 kg   SpO2 100%   BMI 24.62 kg/m   Physical Exam Vitals and nursing note reviewed.  Constitutional:      Appearance: He is well-developed.  HENT:     Head: Normocephalic and atraumatic.  Eyes:     Conjunctiva/sclera: Conjunctivae normal.     Pupils: Pupils are equal, round, and reactive to light.  Cardiovascular:     Rate and Rhythm: Normal rate and regular rhythm.     Heart sounds: Normal heart sounds.  Pulmonary:     Effort: Pulmonary effort is normal. No respiratory distress.     Breath sounds: Normal breath sounds. No rhonchi.  Abdominal:     General: Bowel sounds are normal.     Palpations: Abdomen is soft.     Tenderness: There is no abdominal tenderness. There is no rebound.  Musculoskeletal:        General: Normal range of motion.     Cervical back: Normal range of motion.  Skin:    General: Skin is warm and dry.  Neurological:     Mental Status: He is alert and oriented to person, place, and time.     ED Results / Procedures / Treatments   Labs (all labs ordered are listed, but only abnormal results are displayed) Labs  Reviewed  CBC - Abnormal; Notable for the following components:      Result Value   WBC 11.1 (*)    All other components within normal limits  URINE CULTURE  URINALYSIS, ROUTINE W REFLEX MICROSCOPIC  BASIC METABOLIC PANEL    EKG None  Radiology No results found.  Procedures Procedures  {Document cardiac monitor, telemetry assessment procedure when appropriate:1}  Medications Ordered in ED Medications - No data to display  ED Course/ Medical Decision Making/ A&P   {   Click here for ABCD2, HEART and other calculatorsREFRESH Note before signing :1}                              Medical Decision Making Amount and/or Complexity of Data Reviewed Labs: ordered. Radiology: ordered and independent interpretation performed. ECG/medicine tests: ordered and independent interpretation performed.   ***  {Document critical care time when appropriate:1} {Document review of labs and clinical decision tools ie heart score, Chads2Vasc2 etc:1}  {Document your independent review of radiology images, and any outside records:1} {Document your discussion with family members, caretakers, and with consultants:1} {Document social determinants of health affecting pt's care:1} {Document your decision making why or why not admission, treatments were needed:1} Final Clinical Impression(s) / ED Diagnoses Final diagnoses:  None    Rx / DC Orders ED Discharge Orders     None

## 2023-09-21 LAB — URINALYSIS, ROUTINE W REFLEX MICROSCOPIC
Bilirubin Urine: NEGATIVE
Glucose, UA: NEGATIVE mg/dL
Ketones, ur: 5 mg/dL — AB
Nitrite: NEGATIVE
Protein, ur: 100 mg/dL — AB
RBC / HPF: >50 RBC/hpf (ref 0–5)
Specific Gravity, Urine: 1.016 (ref 1.005–1.030)
pH: 5 (ref 5.0–8.0)

## 2023-09-21 NOTE — Discharge Instructions (Signed)
Urine did have some blood in it today, no significant signs of infection.  We did send for culture. I would avoid NSAIDs (aleve, motrin, ibuprofen, etc) given renal function. Please follow-up with urology-- call for appt. Can also follow-up with primary care doctor. Return here for new concerns.

## 2023-09-22 LAB — URINE CULTURE: Culture: NO GROWTH

## 2023-09-23 ENCOUNTER — Ambulatory Visit (INDEPENDENT_AMBULATORY_CARE_PROVIDER_SITE_OTHER): Payer: Self-pay | Admitting: Licensed Clinical Social Worker

## 2023-09-23 DIAGNOSIS — Z0489 Encounter for examination and observation for other specified reasons: Secondary | ICD-10-CM

## 2023-09-23 NOTE — Progress Notes (Signed)
Patient ID: Jake Smith, male   DOB: Jan 25, 1971, 53 y.o.   MRN: [id number]   Patient was alert and oriented x 5.  He was pleasant, cooperative, maintained good eye contact.  He presented today with euthymic mood\affect.   LCSW inquired what brought patient in today.  Patient reports that he was referred here for an evaluation.  He reports that he is already established with a therapist and psychiatrist.  LCSW explained to patient that services offered here was partial hospitalization program, individual therapy, medication management, and chemical dependency intensive out patient program.  Patient reported that he was already satisfied with the services that he was receiving and would like to remain with his therapist and medication management team.  LCSW provided patient phone number for [facility] behavioral health services at [phone] if he felt services were ever needed or wanted to transfer services to [facility] health.  Virtual Visit via Video Note  I connected with Jake Smith on 09/23/23 at  1:00 PM EST by a video enabled telemedicine application and verified that I am speaking with the correct person using two identifiers.  Location: Patient: [facility]  Provider: Providers Home    I discussed the limitations of evaluation and management by telemedicine and the availability of in person appointments. The patient expressed understanding and agreed to proceed.     I discussed the assessment and treatment plan with the patient. The patient was provided an opportunity to ask questions and all were answered. The patient agreed with the plan and demonstrated an understanding of the instructions.   The patient was advised to call back or seek an in-person evaluation if the symptoms worsen or if the condition fails to improve as anticipated.  I provided 10 minutes of non-face-to-face time during this encounter.   Weber Cooks, LCSW

## 2023-09-23 NOTE — Progress Notes (Signed)
Virtual Visit via Video Note  I connected with Jake Smith on 09/23/23 at  1:00 PM EST by a video enabled telemedicine application and verified that I am speaking with the correct person using two identifiers.  Location: Patient: [facility]  Provider: Providers Home    I discussed the limitations of evaluation and management by telemedicine and the availability of in person appointments. The patient expressed understanding and agreed to proceed.      I discussed the assessment and treatment plan with the patient. The patient was provided an opportunity to ask questions and all were answered. The patient agreed with the plan and demonstrated an understanding of the instructions.   The patient was advised to call back or seek an in-person evaluation if the symptoms worsen or if the condition fails to improve as anticipated.  I provided 10 minutes of non-face-to-face time during this encounter.   Weber Cooks, LCSW

## 2023-09-28 ENCOUNTER — Ambulatory Visit
Payer: No Typology Code available for payment source | Attending: Student in an Organized Health Care Education/Training Program | Admitting: Physical Therapy

## 2023-09-28 NOTE — Therapy (Deleted)
 OUTPATIENT PHYSICAL THERAPY CERVICAL EVALUATION   Patient Name: Jake Smith MRN: [id number] DOB:10-Sep-1970, 53 y.o., male Today's Date: 09/28/2023  END OF SESSION:   Past Medical History:  Diagnosis Date   Depression    Hypertension    Parkinson's disease (HCC)    Sciatica    Tourette's    Past Surgical History:  Procedure Laterality Date   HERNIA REPAIR     Patient Active Problem List   Diagnosis Date Noted   Psychomotor assessment encounter 09/23/2023   Depression 08/06/2023   Healthcare maintenance 07/08/2023   At risk alcohol consumption 07/08/2023   Tobacco use 07/08/2023   Hypertension 07/08/2023   Lower urinary tract symptoms (LUTS) 07/07/2023   Tourette's syndrome 07/07/2023   AKI (acute kidney injury) (HCC) 07/07/2023   Cervical radiculopathy 07/07/2023    PCP: ***  REFERRING PROVIDER: ***  REFERRING DIAG: ***  THERAPY DIAG:  No diagnosis found.  Rationale for Evaluation and Treatment: {HABREHAB:27488}  ONSET DATE: ***  SUBJECTIVE:                                                                                                                                                                                                         SUBJECTIVE STATEMENT: *** Hand dominance: {MISC; OT HAND DOMINANCE:[phone]}  PERTINENT HISTORY:  ***  PAIN:  Are you having pain? {OPRCPAIN:27236}  PRECAUTIONS: {Therapy precautions:24002}  RED FLAGS: {PT Red Flags:29287}     WEIGHT BEARING RESTRICTIONS: {Yes ***/No:24003}  FALLS:  Has patient fallen in last 6 months? {fallsyesno:27318}  LIVING ENVIRONMENT: Lives with: {[facility] lives with:25569::"lives with their family"} Lives in: {Lives in:25570} Stairs: {opstairs:27293} Has following equipment at home: {Assistive devices:23999}  OCCUPATION: ***  PLOF: {PLOF:24004}  PATIENT GOALS: ***  NEXT MD VISIT: ***  OBJECTIVE:  Note: Objective measures were completed at Evaluation unless otherwise  noted.  DIAGNOSTIC FINDINGS:  ***  PATIENT SURVEYS:  {rehab surveys:24030}  COGNITION: Overall cognitive status: {cognition:24006}  SENSATION: {sensation:27233}  POSTURE: {posture:25561}  PALPATION: ***   CERVICAL ROM:   {AROM/PROM:27142} ROM A/PROM (deg) eval  Flexion   Extension   Right lateral flexion   Left lateral flexion   Right rotation   Left rotation    (Blank rows = not tested)  UPPER EXTREMITY ROM:  {AROM/PROM:27142} ROM Right eval Left eval  Shoulder flexion    Shoulder extension    Shoulder abduction    Shoulder adduction    Shoulder extension    Shoulder internal rotation    Shoulder external rotation    Elbow flexion    Elbow extension  Wrist flexion    Wrist extension    Wrist ulnar deviation    Wrist radial deviation    Wrist pronation    Wrist supination     (Blank rows = not tested)  UPPER EXTREMITY MMT:  MMT Right eval Left eval  Shoulder flexion    Shoulder extension    Shoulder abduction    Shoulder adduction    Shoulder extension    Shoulder internal rotation    Shoulder external rotation    Middle trapezius    Lower trapezius    Elbow flexion    Elbow extension    Wrist flexion    Wrist extension    Wrist ulnar deviation    Wrist radial deviation    Wrist pronation    Wrist supination    Grip strength     (Blank rows = not tested)  CERVICAL SPECIAL TESTS:  {Cervical special tests:25246}  FUNCTIONAL TESTS:  {Functional tests:24029}  TREATMENT DATE: ***                                                                                                                                 PATIENT EDUCATION:  Education details: *** Person educated: {Person educated:25204} Education method: {Education Method:25205} Education comprehension: {Education Comprehension:25206}  HOME EXERCISE PROGRAM: ***  ASSESSMENT:  CLINICAL IMPRESSION: Patient is a *** y.o. *** who was seen today for physical therapy  evaluation and treatment for ***.   OBJECTIVE IMPAIRMENTS: {opptimpairments:25111}.   ACTIVITY LIMITATIONS: {activitylimitations:27494}  PARTICIPATION LIMITATIONS: {participationrestrictions:25113}  PERSONAL FACTORS: {Personal factors:25162} are also affecting patient's functional outcome.   REHAB POTENTIAL: {rehabpotential:25112}  CLINICAL DECISION MAKING: {clinical decision making:25114}  EVALUATION COMPLEXITY: {Evaluation complexity:25115}   GOALS: Goals reviewed with patient? {yes/no:20286}  SHORT TERM GOALS: Target date: ***  *** Baseline:  Goal status: INITIAL  2.  *** Baseline:  Goal status: INITIAL  3.  *** Baseline:  Goal status: INITIAL  4.  *** Baseline:  Goal status: INITIAL  5.  *** Baseline:  Goal status: INITIAL  6.  *** Baseline:  Goal status: INITIAL  LONG TERM GOALS: Target date: ***  *** Baseline:  Goal status: INITIAL  2.  *** Baseline:  Goal status: INITIAL  3.  *** Baseline:  Goal status: INITIAL  4.  *** Baseline:  Goal status: INITIAL  5.  *** Baseline:  Goal status: INITIAL  6.  *** Baseline:  Goal status: INITIAL   PLAN:  PT FREQUENCY: {rehab frequency:25116}  PT DURATION: {rehab duration:25117}  PLANNED INTERVENTIONS: {rehab planned interventions:25118::"97110-Therapeutic exercises","97530- Therapeutic [phone]- Neuromuscular re-education","97535- Self JXBJ","47829- Manual therapy"}  PLAN FOR NEXT SESSION: ***   Bauer Ausborn, PT 09/28/2023, 7:44 AM

## 2023-10-14 DIAGNOSIS — F429 Obsessive-compulsive disorder, unspecified: Secondary | ICD-10-CM | POA: Diagnosis not present

## 2023-10-28 DIAGNOSIS — N401 Enlarged prostate with lower urinary tract symptoms: Secondary | ICD-10-CM | POA: Diagnosis not present

## 2023-10-28 DIAGNOSIS — R350 Frequency of micturition: Secondary | ICD-10-CM | POA: Diagnosis not present

## 2023-10-28 DIAGNOSIS — R3914 Feeling of incomplete bladder emptying: Secondary | ICD-10-CM | POA: Diagnosis not present

## 2023-10-28 DIAGNOSIS — N210 Calculus in bladder: Secondary | ICD-10-CM | POA: Diagnosis not present

## 2023-10-28 DIAGNOSIS — N5201 Erectile dysfunction due to arterial insufficiency: Secondary | ICD-10-CM | POA: Diagnosis not present

## 2023-11-03 ENCOUNTER — Other Ambulatory Visit: Payer: Self-pay | Admitting: Urology

## 2023-11-23 NOTE — Progress Notes (Signed)
 Anesthesia Review:  PCP: Cardiologist :  PPM/ ICD: Device Orders: Rep Notified:  Chest x-ray : EKG : 05/05/23  Echo : Stress test: Cardiac Cath :   Activity level:  Sleep Study/ CPAP : Fasting Blood Sugar :      / Checks Blood Sugar -- times a day:    Blood Thinner/ Instructions /Last Dose: ASA / Instructions/ Last Dose :

## 2023-11-24 NOTE — Patient Instructions (Addendum)
 SURGICAL WAITING ROOM VISITATION  Patients having surgery or a procedure may have no more than 2 support people in the waiting area - these visitors may rotate.    Children under the age of 70 must have an adult with them who is not the patient.  Due to an increase in RSV and influenza rates and associated hospitalizations, children ages 93 and under may not visit patients in [facility] hospitals.  Visitors with respiratory illnesses are discouraged from visiting and should remain at home.  If the patient needs to stay at the hospital during part of their recovery, the visitor guidelines for inpatient rooms apply. Pre-op nurse will coordinate an appropriate time for 1 support person to accompany patient in pre-op.  This support person may not rotate.    Please refer to the [facility] website for the visitor guidelines for Inpatients (after your surgery is over and you are in a regular room).       Your procedure is scheduled on:  12/03/2023    Report to [facility] Main Entrance    Report to admitting at  0700 AM   Call this number if you have problems the morning of surgery [phone]   Do not eat food  or drink liquids :After Midnight.                               If you have questions, please contact your surgeon's office.     Oral Hygiene is also important to reduce your risk of infection.                                    Remember - BRUSH YOUR TEETH THE MORNING OF SURGERY WITH YOUR REGULAR TOOTHPASTE  DENTURES WILL BE REMOVED PRIOR TO SURGERY PLEASE DO NOT APPLY "Poly grip" OR ADHESIVES!!!   Do NOT smoke after Midnight   Stop all vitamins and herbal supplements 7 days before surgery.   Take these medicines the morning of surgery with A SIP OF WATER:  coreg, lexapro if needed, gabapentin if needed, Haldol   DO NOT TAKE ANY ORAL DIABETIC MEDICATIONS DAY OF YOUR SURGERY  Bring CPAP mask and tubing day of surgery.                              You may not  have any metal on your body including hair pins, jewelry, and body piercing             Do not wear make-up, lotions, powders, perfumes/cologne, or deodorant  Do not wear nail polish including gel and S&S, artificial/acrylic nails, or any other type of covering on natural nails including finger and toenails. If you have artificial nails, gel coating, etc. that needs to be removed by a nail salon please have this removed prior to surgery or surgery may need to be canceled/ delayed if the surgeon/ anesthesia feels like they are unable to be safely monitored.   Do not shave  48 hours prior to surgery.               Men may shave face and neck.   Do not bring valuables to the hospital. [facility] IS NOT             RESPONSIBLE   FOR VALUABLES.  Contacts, glasses, dentures or bridgework may not be worn into surgery.   Bring small overnight bag day of surgery.   DO NOT BRING YOUR HOME MEDICATIONS TO THE HOSPITAL. PHARMACY WILL DISPENSE MEDICATIONS LISTED ON YOUR MEDICATION LIST TO YOU DURING YOUR ADMISSION IN THE HOSPITAL!    Patients discharged on the day of surgery will not be allowed to drive home.  Someone NEEDS to stay with you for the first 24 hours after anesthesia.   Special Instructions: Bring a copy of your healthcare power of attorney and living will documents the day of surgery if you haven't scanned them before.              Please read over the following fact sheets you were given: IF YOU HAVE QUESTIONS ABOUT YOUR PRE-OP INSTRUCTIONS PLEASE CALL [phone]   If you received a COVID test during your pre-op visit  it is requested that you wear a mask when out in public, stay away from anyone that may not be feeling well and notify your surgeon if you develop symptoms. If you test positive for Covid or have been in contact with anyone that has tested positive in the last 10 days please notify you surgeon.    [place] - Preparing for Surgery Before surgery, you can play an  important role.  Because skin is not sterile, your skin needs to be as free of germs as possible.  You can reduce the number of germs on your skin by washing with CHG (chlorahexidine gluconate) soap before surgery.  CHG is an antiseptic cleaner which kills germs and bonds with the skin to continue killing germs even after washing. Please DO NOT use if you have an allergy to CHG or antibacterial soaps.  If your skin becomes reddened/irritated stop using the CHG and inform your nurse when you arrive at Short Stay. Do not shave (including legs and underarms) for at least 48 hours prior to the first CHG shower.  You may shave your face/neck. Please follow these instructions carefully:  1.  Shower with CHG Soap the night before surgery and the  morning of Surgery.  2.  If you choose to wash your hair, wash your hair first as usual with your  normal  shampoo.  3.  After you shampoo, rinse your hair and body thoroughly to remove the  shampoo.                           4.  Use CHG as you would any other liquid soap.  You can apply chg directly  to the skin and wash                       Gently with a scrungie or clean washcloth.  5.  Apply the CHG Soap to your body ONLY FROM THE NECK DOWN.   Do not use on face/ open                           Wound or open sores. Avoid contact with eyes, ears mouth and genitals (private parts).                       Wash face,  Genitals (private parts) with your normal soap.             6.  Wash thoroughly, paying special attention to the area where  your surgery  will be performed.  7.  Thoroughly rinse your body with warm water from the neck down.  8.  DO NOT shower/wash with your normal soap after using and rinsing off  the CHG Soap.                9.  Pat yourself dry with a clean towel.            10.  Wear clean pajamas.            11.  Place clean sheets on your bed the night of your first shower and do not  sleep with pets. Day of Surgery : Do not apply any  lotions/deodorants the morning of surgery.  Please wear clean clothes to the hospital/surgery center.  FAILURE TO FOLLOW THESE INSTRUCTIONS MAY RESULT IN THE CANCELLATION OF YOUR SURGERY PATIENT SIGNATURE_________________________________  NURSE SIGNATURE__________________________________  ________________________________________________________________________

## 2023-11-25 DIAGNOSIS — F429 Obsessive-compulsive disorder, unspecified: Secondary | ICD-10-CM | POA: Diagnosis not present

## 2023-11-26 ENCOUNTER — Encounter (HOSPITAL_COMMUNITY)
Admission: RE | Admit: 2023-11-26 | Discharge: 2023-11-26 | Disposition: A | Discharge status: Home / Self Care | Source: Ambulatory Visit | Attending: Urology | Admitting: Urology

## 2023-11-26 ENCOUNTER — Other Ambulatory Visit: Payer: Self-pay

## 2023-11-26 ENCOUNTER — Encounter (HOSPITAL_COMMUNITY): Payer: Self-pay

## 2023-11-26 VITALS — BP 147/93 | HR 74 | Temp 98.4°F | Resp 16 | Ht 75.0 in | Wt 193.0 lb

## 2023-11-26 DIAGNOSIS — Z01818 Encounter for other preprocedural examination: Secondary | ICD-10-CM

## 2023-11-26 DIAGNOSIS — Z01812 Encounter for preprocedural laboratory examination: Secondary | ICD-10-CM | POA: Diagnosis not present

## 2023-11-26 HISTORY — DX: Dyspnea, unspecified: R06.00

## 2023-11-26 HISTORY — DX: Pneumonia, unspecified organism: J18.9

## 2023-11-26 HISTORY — DX: Gastro-esophageal reflux disease without esophagitis: K21.9

## 2023-11-26 HISTORY — DX: Unspecified osteoarthritis, unspecified site: M19.90

## 2023-11-26 HISTORY — DX: Headache, unspecified: R51.9

## 2023-11-26 LAB — CBC
HCT: 47.6 % (ref 39.0–52.0)
Hemoglobin: 15 g/dL (ref 13.0–17.0)
MCH: 29.8 pg (ref 26.0–34.0)
MCHC: 31.5 g/dL (ref 30.0–36.0)
MCV: 94.6 fL (ref 80.0–100.0)
Platelets: 302 10*3/uL (ref 150–400)
RBC: 5.03 MIL/uL (ref 4.22–5.81)
RDW: 15.4 % (ref 11.5–15.5)
WBC: 10.5 10*3/uL (ref 4.0–10.5)
nRBC: 0 % (ref 0.0–0.2)

## 2023-11-26 LAB — BASIC METABOLIC PANEL WITH GFR
Anion gap: 6 (ref 5–15)
BUN: 15 mg/dL (ref 6–20)
CO2: 23 mmol/L (ref 22–32)
Calcium: 8.9 mg/dL (ref 8.9–10.3)
Chloride: 111 mmol/L (ref 98–111)
Creatinine, Ser: 1.17 mg/dL (ref 0.61–1.24)
GFR, Estimated: >60 mL/min (ref >60)
Glucose, Bld: 89 mg/dL (ref 70–99)
Potassium: 4.5 mmol/L (ref 3.5–5.1)
Sodium: 140 mmol/L (ref 135–145)

## 2023-11-27 ENCOUNTER — Encounter (HOSPITAL_COMMUNITY)

## 2023-11-27 DIAGNOSIS — M25512 Pain in left shoulder: Secondary | ICD-10-CM | POA: Diagnosis not present

## 2023-11-27 DIAGNOSIS — M4722 Other spondylosis with radiculopathy, cervical region: Secondary | ICD-10-CM | POA: Diagnosis not present

## 2023-11-27 DIAGNOSIS — G8929 Other chronic pain: Secondary | ICD-10-CM | POA: Diagnosis not present

## 2023-11-27 DIAGNOSIS — M542 Cervicalgia: Secondary | ICD-10-CM | POA: Diagnosis not present

## 2023-11-30 NOTE — H&P (Signed)
 CC/HPI: cc: Gross hematuria, BPH with LUTS   10/28/2023: 53 year old man comes in with gross hematuria and BPH with LUTS. He had a CT scan of the abdomen pelvis that showed a large bladder calculus (14mm) as well as prostatomegaly (80g by CT). No family history of prostate cancer. Patient had PSA that was 1.3 in November 2024. He also has ED and has not yet tried any thing for this. He is on 0.8 mg tamsulosin nightly.   IPSS: 4, 4, 3, 4, 3, 4, 3= 25/35  6/6 terrible quality of life     ALLERGIES: Egg-Derived Products - Other Reaction, Intolerance Shellfish - Swelling Tylenol - Nausea, Vomiting    MEDICATIONS: Lisinopril 40 mg tablet  Omeprazole 20 mg capsule,delayed release  Tamsulosin Hcl 0.4 mg capsule  Amlodipine Besylate 10 mg tablet  Benztropine Mesylate 2 mg tablet  Carvedilol  Escitalopram Oxalate 20 mg tablet  Gabapentin 300 mg capsule  Haloperidol 10 mg tablet  Loratadine 10 mg tablet     GU PSH: None   NON-GU PSH: Hernia Repair     GU PMH: None     PMH Notes: Tourett's syndrome  Acute kidney injury  Cervical radiculopathy   NON-GU PMH: Anxiety Depression DVT, History GERD    FAMILY HISTORY: No Family History    SOCIAL HISTORY: Marital Status: Single Preferred Language: English; Ethnicity: Not Hispanic Or Latino; Race: Black or African American Current Smoking Status: Patient smokes.   Tobacco Use Assessment Completed: Used Tobacco in last 30 days? Does drink.  Drinks 1 caffeinated drink per day.    REVIEW OF SYSTEMS:    GU Review Male:   Patient reports frequent urination, hard to postpone urination, burning/ pain with urination, get up at night to urinate, leakage of urine, stream starts and stops, trouble starting your stream, have to strain to urinate , and erection problems. Patient denies penile pain.  Gastrointestinal (Upper):   Patient reports nausea and indigestion/ heartburn. Patient denies vomiting.  Gastrointestinal (Lower):   Patient  reports diarrhea and constipation.   Constitutional:   Patient reports weight loss and fatigue. Patient denies fever and night sweats.  Skin:   Patient reports skin rash/ lesion and itching.   Eyes:   Patient reports blurred vision. Patient denies double vision.  Ears/ Nose/ Throat:   Patient reports sinus problems. Patient denies sore throat.  Hematologic/Lymphatic:   Patient denies swollen glands and easy bruising.  Cardiovascular:   Patient reports leg swelling and chest pains.   Respiratory:   Patient reports cough and shortness of breath.   Endocrine:   Patient denies excessive thirst.  Musculoskeletal:   Patient reports back pain and joint pain.   Neurological:   Patient reports headaches. Patient denies dizziness.  Psychologic:   Patient reports depression and anxiety.    VITAL SIGNS:      10/28/2023 10:27 AM  Weight 195 lb / 88.45 kg  Height 75 in / 190.5 cm  BP 127/85 mmHg  Pulse 78 /min  Temperature 98.6 F / 37 C  BMI 24.4 kg/m   MULTI-SYSTEM PHYSICAL EXAMINATION:    Constitutional: Well-nourished. No physical deformities. Normally developed. Good grooming.  Neck: Neck symmetrical, not swollen. Normal tracheal position.  Respiratory: No labored breathing, no use of accessory muscles.   Skin: No paleness, no jaundice, no cyanosis. No lesion, no ulcer, no rash.  Neurologic / Psychiatric: Oriented to time, oriented to place, oriented to person. No depression, no anxiety, no agitation.  Eyes: Normal conjunctivae. Normal eyelids.  Ears, Nose, Mouth, and Throat: Left ear no scars, no lesions, no masses. Right ear no scars, no lesions, no masses. Nose no scars, no lesions, no masses. Normal hearing. Normal lips.  Musculoskeletal: Normal gait and station of head and neck.     Complexity of Data:  Records Review:   AUA Symptom Score, Previous Patient Records, POC Tool  Urine Test Review:   Urinalysis  Urodynamics Review:   Review Bladder Scan  X-Ray Review: C.T. Abdomen/Pelvis:  Reviewed Films. Reviewed Report. Discussed With Patient. IMPRESSION:  1. No renal calculus or obstructive uropathy bilaterally.  2. Mild bladder wall thickening and bladder calculus, possible  infectious or inflammatory cystitis.  3. Enlarged prostate gland.  4. Diverticulosis without diverticulitis.  5. Aortic atherosclerosis.    Electronically Signed  By: Thornell Sartorius M.D.  On: 09/20/2023 22:55      PROCEDURES:         PVR Ultrasound - 96045  Scanned Volume: 230 cc         Urinalysis w/Scope Dipstick Dipstick Cont'd Micro  Color: Yellow Bilirubin: Neg mg/dL WBC/hpf: NS (Not Seen)  Appearance: Clear Ketones: Neg mg/dL RBC/hpf: 3 - 40/JWJ  Specific Gravity: 1.025 Blood: 3+ ery/uL Bacteria: NS (Not Seen)  pH: <=5.0 Protein: Neg mg/dL Cystals: Amorph Urates  Glucose: Neg mg/dL Urobilinogen: 0.2 mg/dL Casts: NS (Not Seen)    Nitrites: Neg Trichomonas: Not Present    Leukocyte Esterase: Neg leu/uL Mucous: Not Present      Epithelial Cells: 0 - 5/hpf      Yeast: NS (Not Seen)      Sperm: Not Present    ASSESSMENT:      ICD-10 Details  1 GU:   BPH w/LUTS - N40.1 Chronic, Worsening  2   Urinary Frequency - R35.0 Chronic, Worsening  3   Incomplete bladder emptying - R39.14 Chronic, Worsening  4   Bladder Stone - N21.0 Chronic, Worsening  5   ED due to arterial insufficiency - N52.01 Chronic, Worsening   PLAN:            Medications New Meds: Sildenafil Citrate 20 mg tablet 1 tablet PO Daily PRN take 1-5 pills 1 hour prior to sexual activity  #30  1 Refill(s)  Pharmacy Name:  [facility] [phone]  Address:  [address]   [place], [place] [address]  Phone:  [phone]  Fax:  [phone]            Document Letter(s):  Created for Patient: Clinical Summary         Notes:   BPH with LUTS  Bladder calculus   -We discussed that bladder calculus is an indication that patient is not emptying his bladder well. He does have an enlarged prostate on imaging. We  discussed moving forward with a cystoscopy lithotripsy and TURP.  -Risks and benefits of the procedure sick with the patient detail including but not limited to pain, bleeding, infection, retrograde ejaculation, damage to surrounding structures, need for additional treatment, prostate regrowth, worsening urinary urgency/incontinence   ED: trial of sildenafil. HE was counseled how to use and about possible side effects

## 2023-12-03 ENCOUNTER — Ambulatory Visit (HOSPITAL_COMMUNITY): Admitting: Anesthesiology

## 2023-12-03 ENCOUNTER — Encounter (HOSPITAL_COMMUNITY)
Admission: RE | Disposition: A | Discharge status: Home / Self Care | Payer: Self-pay | Source: Home / Self Care | Attending: Urology

## 2023-12-03 ENCOUNTER — Encounter (HOSPITAL_COMMUNITY): Payer: Self-pay | Admitting: Urology

## 2023-12-03 ENCOUNTER — Observation Stay (HOSPITAL_COMMUNITY)
Admission: RE | Admit: 2023-12-03 | Discharge: 2023-12-04 | Disposition: A | Discharge status: Home / Self Care | Attending: Urology | Admitting: Urology

## 2023-12-03 ENCOUNTER — Ambulatory Visit (HOSPITAL_BASED_OUTPATIENT_CLINIC_OR_DEPARTMENT_OTHER): Admitting: Anesthesiology

## 2023-12-03 ENCOUNTER — Other Ambulatory Visit: Payer: Self-pay

## 2023-12-03 DIAGNOSIS — N138 Other obstructive and reflux uropathy: Secondary | ICD-10-CM

## 2023-12-03 DIAGNOSIS — N320 Bladder-neck obstruction: Secondary | ICD-10-CM | POA: Diagnosis not present

## 2023-12-03 DIAGNOSIS — N210 Calculus in bladder: Secondary | ICD-10-CM | POA: Diagnosis not present

## 2023-12-03 DIAGNOSIS — N401 Enlarged prostate with lower urinary tract symptoms: Principal | ICD-10-CM | POA: Insufficient documentation

## 2023-12-03 DIAGNOSIS — I10 Essential (primary) hypertension: Secondary | ICD-10-CM | POA: Diagnosis not present

## 2023-12-03 DIAGNOSIS — Z79899 Other long term (current) drug therapy: Secondary | ICD-10-CM | POA: Insufficient documentation

## 2023-12-03 DIAGNOSIS — R3914 Feeling of incomplete bladder emptying: Secondary | ICD-10-CM | POA: Insufficient documentation

## 2023-12-03 DIAGNOSIS — R350 Frequency of micturition: Secondary | ICD-10-CM | POA: Diagnosis not present

## 2023-12-03 DIAGNOSIS — Z86718 Personal history of other venous thrombosis and embolism: Secondary | ICD-10-CM | POA: Diagnosis not present

## 2023-12-03 DIAGNOSIS — Z01818 Encounter for other preprocedural examination: Secondary | ICD-10-CM

## 2023-12-03 DIAGNOSIS — N400 Enlarged prostate without lower urinary tract symptoms: Secondary | ICD-10-CM | POA: Diagnosis not present

## 2023-12-03 HISTORY — PX: TRANSURETHRAL RESECTION OF PROSTATE: SHX73

## 2023-12-03 HISTORY — PX: CYSTOSCOPY WITH LITHOLAPAXY: SHX1425

## 2023-12-03 SURGERY — CYSTOSCOPY, WITH BLADDER CALCULUS LITHOLAPAXY
Anesthesia: General | Site: Prostate

## 2023-12-03 MED ORDER — CEFAZOLIN SODIUM-DEXTROSE 2-4 GM/100ML-% IV SOLN
2.0000 g | Freq: Three times a day (TID) | INTRAVENOUS | Status: AC
  Administered 2023-12-03 (×2): 2 g via INTRAVENOUS
  Filled 2023-12-03 (×2): qty 100

## 2023-12-03 MED ORDER — EPHEDRINE 5 MG/ML INJ
INTRAVENOUS | Status: AC
  Filled 2023-12-03: qty 5

## 2023-12-03 MED ORDER — OXYCODONE HCL 5 MG PO TABS
5.0000 mg | ORAL_TABLET | Freq: Once | ORAL | Status: AC | PRN
  Administered 2023-12-03: 5 mg via ORAL

## 2023-12-03 MED ORDER — FENTANYL CITRATE (PF) 100 MCG/2ML IJ SOLN
INTRAMUSCULAR | Status: DC | PRN
  Administered 2023-12-03 (×6): 50 ug via INTRAVENOUS

## 2023-12-03 MED ORDER — LIDOCAINE HCL (PF) 2 % IJ SOLN
INTRAMUSCULAR | Status: AC
  Filled 2023-12-03: qty 5

## 2023-12-03 MED ORDER — SODIUM CHLORIDE 0.9 % IV SOLN: 250.0000 mL | INTRAVENOUS | Status: DC | PRN

## 2023-12-03 MED ORDER — DIPHENHYDRAMINE HCL 12.5 MG/5ML PO ELIX: 12.5000 mg | ORAL_SOLUTION | Freq: Four times a day (QID) | ORAL | Status: DC | PRN

## 2023-12-03 MED ORDER — ONDANSETRON HCL 4 MG/2ML IJ SOLN
INTRAMUSCULAR | Status: DC | PRN
  Administered 2023-12-03: 4 mg via INTRAVENOUS

## 2023-12-03 MED ORDER — PROPOFOL 10 MG/ML IV BOLUS
INTRAVENOUS | Status: AC
  Filled 2023-12-03: qty 20

## 2023-12-03 MED ORDER — ONDANSETRON HCL 4 MG/2ML IJ SOLN
INTRAMUSCULAR | Status: AC
  Filled 2023-12-03: qty 2

## 2023-12-03 MED ORDER — FENTANYL CITRATE (PF) 100 MCG/2ML IJ SOLN
INTRAMUSCULAR | Status: AC
  Filled 2023-12-03: qty 2

## 2023-12-03 MED ORDER — ROCURONIUM BROMIDE 10 MG/ML (PF) SYRINGE
PREFILLED_SYRINGE | INTRAVENOUS | Status: AC
  Filled 2023-12-03: qty 10

## 2023-12-03 MED ORDER — ONDANSETRON HCL 4 MG/2ML IJ SOLN: 4.0000 mg | INTRAMUSCULAR | Status: DC | PRN

## 2023-12-03 MED ORDER — SODIUM CHLORIDE 0.9 % IR SOLN
3000.0000 mL | Status: DC
  Administered 2023-12-03 (×2): 3000 mL

## 2023-12-03 MED ORDER — FENTANYL CITRATE PF 50 MCG/ML IJ SOSY
PREFILLED_SYRINGE | INTRAMUSCULAR | Status: AC
  Filled 2023-12-03: qty 1

## 2023-12-03 MED ORDER — ROCURONIUM BROMIDE 10 MG/ML (PF) SYRINGE
PREFILLED_SYRINGE | INTRAVENOUS | Status: DC | PRN
  Administered 2023-12-03: 50 mg via INTRAVENOUS

## 2023-12-03 MED ORDER — STERILE WATER FOR IRRIGATION IR SOLN
Status: DC | PRN
  Administered 2023-12-03: 250 mL

## 2023-12-03 MED ORDER — FENTANYL CITRATE PF 50 MCG/ML IJ SOSY
25.0000 ug | PREFILLED_SYRINGE | INTRAMUSCULAR | Status: DC | PRN
  Administered 2023-12-03 (×3): 50 ug via INTRAVENOUS

## 2023-12-03 MED ORDER — DEXAMETHASONE SODIUM PHOSPHATE 10 MG/ML IJ SOLN
INTRAMUSCULAR | Status: DC | PRN
  Administered 2023-12-03: 10 mg via INTRAVENOUS

## 2023-12-03 MED ORDER — 0.9 % SODIUM CHLORIDE (POUR BTL) OPTIME
TOPICAL | Status: DC | PRN
  Administered 2023-12-03: 1000 mL

## 2023-12-03 MED ORDER — CHLORHEXIDINE GLUCONATE 0.12 % MT SOLN
15.0000 mL | Freq: Once | OROMUCOSAL | Status: AC
  Administered 2023-12-03: 15 mL via OROMUCOSAL

## 2023-12-03 MED ORDER — OXYCODONE HCL 5 MG PO TABS
ORAL_TABLET | ORAL | Status: AC
  Filled 2023-12-03: qty 1

## 2023-12-03 MED ORDER — SODIUM CHLORIDE 0.9% FLUSH: 3.0000 mL | INTRAVENOUS | Status: DC | PRN

## 2023-12-03 MED ORDER — TRIPLE ANTIBIOTIC 3.5-400-5000 EX OINT: 1.0000 | TOPICAL_OINTMENT | Freq: Three times a day (TID) | CUTANEOUS | Status: DC | PRN

## 2023-12-03 MED ORDER — SENNOSIDES-DOCUSATE SODIUM 8.6-50 MG PO TABS
2.0000 | ORAL_TABLET | Freq: Every day | ORAL | Status: DC
  Administered 2023-12-03: 2 via ORAL
  Filled 2023-12-03: qty 2

## 2023-12-03 MED ORDER — MIDAZOLAM HCL 2 MG/2ML IJ SOLN
INTRAMUSCULAR | Status: AC
  Filled 2023-12-03: qty 2

## 2023-12-03 MED ORDER — DEXTROSE-SODIUM CHLORIDE 5-0.45 % IV SOLN: INTRAVENOUS | Status: DC

## 2023-12-03 MED ORDER — CEFAZOLIN SODIUM-DEXTROSE 2-4 GM/100ML-% IV SOLN
2.0000 g | INTRAVENOUS | Status: AC
  Administered 2023-12-03: 2 g via INTRAVENOUS
  Filled 2023-12-03: qty 100

## 2023-12-03 MED ORDER — ORAL CARE MOUTH RINSE: 15.0000 mL | Freq: Once | OROMUCOSAL | Status: AC

## 2023-12-03 MED ORDER — PROPOFOL 10 MG/ML IV BOLUS
INTRAVENOUS | Status: DC | PRN
  Administered 2023-12-03: 200 mg via INTRAVENOUS

## 2023-12-03 MED ORDER — CHLORHEXIDINE GLUCONATE CLOTH 2 % EX PADS: 6.0000 | MEDICATED_PAD | Freq: Every day | CUTANEOUS | Status: DC

## 2023-12-03 MED ORDER — LACTATED RINGERS IV SOLN
INTRAVENOUS | Status: DC
Start: 2023-12-03 — End: 2023-12-03

## 2023-12-03 MED ORDER — DIPHENHYDRAMINE HCL 50 MG/ML IJ SOLN: 12.5000 mg | Freq: Four times a day (QID) | INTRAMUSCULAR | Status: DC | PRN

## 2023-12-03 MED ORDER — SODIUM CHLORIDE 0.9 % IR SOLN
Status: DC | PRN
  Administered 2023-12-03: 3000 mL
  Administered 2023-12-03 (×3): 6000 mL
  Administered 2023-12-03: 3000 mL
  Administered 2023-12-03: 6000 mL

## 2023-12-03 MED ORDER — ONDANSETRON HCL 4 MG/2ML IJ SOLN: 4.0000 mg | Freq: Four times a day (QID) | INTRAMUSCULAR | Status: DC | PRN

## 2023-12-03 MED ORDER — OXYCODONE HCL 5 MG/5ML PO SOLN: 5.0000 mg | Freq: Once | ORAL | Status: AC | PRN

## 2023-12-03 MED ORDER — DEXAMETHASONE SODIUM PHOSPHATE 10 MG/ML IJ SOLN
INTRAMUSCULAR | Status: AC
  Filled 2023-12-03: qty 1

## 2023-12-03 MED ORDER — MIDAZOLAM HCL 5 MG/5ML IJ SOLN
INTRAMUSCULAR | Status: DC | PRN
  Administered 2023-12-03: 2 mg via INTRAVENOUS

## 2023-12-03 MED ORDER — SUGAMMADEX SODIUM 200 MG/2ML IV SOLN
INTRAVENOUS | Status: AC
  Filled 2023-12-03: qty 2

## 2023-12-03 MED ORDER — SUGAMMADEX SODIUM 200 MG/2ML IV SOLN
INTRAVENOUS | Status: DC | PRN
  Administered 2023-12-03: 200 mg via INTRAVENOUS

## 2023-12-03 MED ORDER — SODIUM CHLORIDE 0.9% FLUSH: 3.0000 mL | Freq: Two times a day (BID) | INTRAVENOUS | Status: DC

## 2023-12-03 MED ORDER — MORPHINE SULFATE (PF) 2 MG/ML IV SOLN
2.0000 mg | INTRAVENOUS | Status: DC | PRN
  Filled 2023-12-03: qty 1

## 2023-12-03 MED ORDER — FENTANYL CITRATE PF 50 MCG/ML IJ SOSY
PREFILLED_SYRINGE | INTRAMUSCULAR | Status: AC
Start: 2023-12-03 — End: 2023-12-03
  Filled 2023-12-03: qty 1

## 2023-12-03 MED ORDER — LIDOCAINE HCL (PF) 2 % IJ SOLN
INTRAMUSCULAR | Status: DC | PRN
  Administered 2023-12-03: 100 mg via INTRADERMAL

## 2023-12-03 MED ORDER — OXYCODONE HCL 5 MG PO TABS
5.0000 mg | ORAL_TABLET | ORAL | Status: DC | PRN
  Administered 2023-12-03 – 2023-12-04 (×5): 5 mg via ORAL
  Filled 2023-12-03 (×5): qty 1

## 2023-12-03 SURGICAL SUPPLY — 20 items
BAG URINE DRAIN 2000ML AR STRL (UROLOGICAL SUPPLIES) IMPLANT
BAG URO CATCHER STRL LF (MISCELLANEOUS) ×2 IMPLANT
CATH FOLEY 3WAY 30CC 22FR (CATHETERS) IMPLANT
CATH HEMA 3WAY 30CC 22FR COUDE (CATHETERS) IMPLANT
DRAPE FOOT SWITCH (DRAPES) ×2 IMPLANT
FIBER LASER MOSES 550 DFL (Laser) IMPLANT
GLOVE BIO SURGEON STRL SZ 6.5 (GLOVE) ×2 IMPLANT
GOWN STRL REUS W/ TWL LRG LVL3 (GOWN DISPOSABLE) ×2 IMPLANT
HOLDER FOLEY CATH W/STRAP (MISCELLANEOUS) ×2 IMPLANT
KIT TURNOVER KIT A (KITS) IMPLANT
LOOP CUT BIPOLAR 24F LRG (ELECTROSURGICAL) ×2 IMPLANT
MANIFOLD NEPTUNE II (INSTRUMENTS) ×2 IMPLANT
PACK CYSTO (CUSTOM PROCEDURE TRAY) ×2 IMPLANT
PAD PREP 24X48 CUFFED NSTRL (MISCELLANEOUS) ×2 IMPLANT
PLUG CATH AND CAP STRL 200 (CATHETERS) ×2 IMPLANT
SYR 30ML LL (SYRINGE) IMPLANT
SYR TOOMEY IRRIG 70ML (MISCELLANEOUS) ×2 IMPLANT
SYRINGE TOOMEY IRRIG 70ML (MISCELLANEOUS) ×2 IMPLANT
TUBING CONNECTING 10 (TUBING) ×2 IMPLANT
TUBING UROLOGY SET (TUBING) ×2 IMPLANT

## 2023-12-03 NOTE — Anesthesia Postprocedure Evaluation (Signed)
 Anesthesia Post Note  Patient: Jake Smith  Procedure(s) Performed: CYSTOSCOPY, WITH BLADDER CALCULUS LITHOLAPAXY (Bladder) TURP (TRANSURETHRAL RESECTION OF PROSTATE) (Prostate)     Patient location during evaluation: PACU Anesthesia Type: General Level of consciousness: awake and alert Pain management: pain level controlled Vital Signs Assessment: post-procedure vital signs reviewed and stable Respiratory status: spontaneous breathing, nonlabored ventilation, respiratory function stable and patient connected to nasal cannula oxygen Cardiovascular status: blood pressure returned to baseline and stable Postop Assessment: no apparent nausea or vomiting Anesthetic complications: no   No notable events documented.  Last Vitals:  Vitals:   12/03/23 1015 12/03/23 1030  BP: (!) 143/107 (!) 134/97  Pulse: 91 85  Resp: 15 18  Temp:    SpO2: 96% 95%    Last Pain:  Vitals:   12/03/23 1014  TempSrc:   PainSc: 7                  Nashya Garlington S

## 2023-12-03 NOTE — Op Note (Signed)
 Preoperative diagnosis: Bladder outlet obstruction secondary to BPH  Postoperative diagnosis:  Bladder outlet obstruction secondary to BPH  Procedure:  Cystoscopy Transurethral resection of the prostate Cystolitholopaxy Urethral dilation with male sounds  Surgeon: Kasandra Knudsen, MD  Anesthesia: General  Complications: None  EBL: Minimal  Specimens: Prostate chips Bladder stone  Indication: Jake Smith is a patient with bladder outlet obstruction secondary to benign prostatic hyperplasia and bladder calculus. After reviewing the management options for treatment, he elected to proceed with the above surgical procedure(s). We have discussed the potential benefits and risks of the procedure, side effects of the proposed treatment, the likelihood of the patient achieving the goals of the procedure, and any potential problems that might occur during the procedure or recuperation. Informed consent has been obtained.  Description of procedure:  The patient was taken to the operating room and general anesthesia was induced.  The patient was placed in the dorsal lithotomy position, prepped and draped in the usual sterile fashion, and preoperative antibiotics were administered. A preoperative time-out was performed.   Cystourethroscopy was performed.  The patient's urethra was examined and  bilobar prostatic hypertrophy with a median lobe.   The bladder was then systematically examined in its entirety. There was no evidence of any bladder tumors or other mucosal pathology. The ureteral orifices were identified so as to be avoided during the procedure.  Urethral sounds were used to dilate the urethral meatus from 22 [place] to 28 [place].  Next there resectoscope was advanced into the bladder using the visual obturator.  A 2.5 cm bladder stone was visualized.The laser bridge was then assembled and a 500 m holmium laser fiber was used to fragment the stone.  Once the stone had been  fragmented pieces small enough to come to the resectoscope the biopolar electrocautery loop was assembled with the working element.  The prostate adenoma was then resected utilizing loop cautery resection with the bipolar cutting loop.  The prostate adenoma from the bladder neck back to the verumontanum was resected beginning at the six o'clock position and then extended to include the right and left lobes of the prostate and anterior prostate. Care was taken not to resect distal to the verumontanum.  Hemostasis was then achieved with the cautery and the bladder was emptied and reinspected with no significant bleeding noted at the end of the procedure.    A 22Fr 3-way catheter was then placed into the bladder.  The patient appeared to tolerate the procedure well and without complications.  The patient was able to be awakened and transferred to the recovery unit in satisfactory condition.

## 2023-12-03 NOTE — Anesthesia Procedure Notes (Signed)
 Procedure Name: Intubation Date/Time: 12/03/2023 8:24 AM  Performed by: Doran Clay, CRNAPre-anesthesia Checklist: Patient identified, Emergency Drugs available, Suction available, Patient being monitored and Timeout performed Patient Re-evaluated:Patient Re-evaluated prior to induction Oxygen Delivery Method: Circle system utilized Preoxygenation: Pre-oxygenation with 100% oxygen Induction Type: IV induction Ventilation: Mask ventilation without difficulty and Oral airway inserted - appropriate to patient size Laryngoscope Size: Mac and 4 Grade View: Grade I Tube type: Oral Tube size: 7.5 mm Number of attempts: 1 Airway Equipment and Method: Stylet Placement Confirmation: ETT inserted through vocal cords under direct vision, positive ETCO2 and breath sounds checked- equal and bilateral Secured at: 23 cm Tube secured with: Tape Dental Injury: Teeth and Oropharynx as per pre-operative assessment

## 2023-12-03 NOTE — Interval H&P Note (Signed)
 History and Physical Interval Note:  12/03/2023 8:09 AM  Jake Smith  has presented today for surgery, with the diagnosis of BENIGN PROSTATIC HYPERPLASIA WITH LOWER URINARY TRACT SYMPTOMS, BLADDER STONE.  The various methods of treatment have been discussed with the patient and family. After consideration of risks, benefits and other options for treatment, the patient has consented to  Procedure(s): CYSTOSCOPY, WITH BLADDER CALCULUS LITHOLAPAXY (N/A) TURP (TRANSURETHRAL RESECTION OF PROSTATE) (N/A) as a surgical intervention.  The patient's history has been reviewed, patient examined, no change in status, stable for surgery.  I have reviewed the patient's chart and labs.  Questions were answered to the patient's satisfaction.     Kirra Verga D Chasity Outten

## 2023-12-03 NOTE — Transfer of Care (Signed)
 Immediate Anesthesia Transfer of Care Note  Patient: Jake Smith  Procedure(s) Performed: CYSTOSCOPY, WITH BLADDER CALCULUS LITHOLAPAXY (Bladder) TURP (TRANSURETHRAL RESECTION OF PROSTATE) (Prostate)  Patient Location: PACU  Anesthesia Type:General  Level of Consciousness: sedated  Airway & Oxygen Therapy: Patient Spontanous Breathing and Patient connected to face mask oxygen  Post-op Assessment: Report given to RN and Post -op Vital signs reviewed and stable  Post vital signs: Reviewed and stable  Last Vitals:  Vitals Value Taken Time  BP    Temp    Pulse 113 12/03/23 1004  Resp 18 12/03/23 1004  SpO2 100 % 12/03/23 1004  Vitals shown include unfiled device data.  Last Pain:  Vitals:   12/03/23 0630  TempSrc: Oral         Complications: No notable events documented.

## 2023-12-03 NOTE — Anesthesia Preprocedure Evaluation (Signed)
 Anesthesia Evaluation  Patient identified by MRN, date of birth, ID band Patient awake    Reviewed: Allergy & Precautions, H&P , NPO status , Patient's Chart, lab work & pertinent test results  Airway Mallampati: II   Neck ROM: full    Dental   Pulmonary shortness of breath, Current Smoker   breath sounds clear to auscultation       Cardiovascular hypertension,  Rhythm:regular Rate:Normal     Neuro/Psych  Headaches PSYCHIATRIC DISORDERS  Depression    Parkinson's dz  Neuromuscular disease    GI/Hepatic ,GERD  ,,  Endo/Other    Renal/GU      Musculoskeletal  (+) Arthritis ,    Abdominal   Peds  Hematology   Anesthesia Other Findings   Reproductive/Obstetrics                             Anesthesia Physical Anesthesia Plan  ASA: 2  Anesthesia Plan: General   Post-op Pain Management:    Induction: Intravenous  PONV Risk Score and Plan: 1 and Ondansetron, Dexamethasone, Midazolam and Treatment may vary due to age or medical condition  Airway Management Planned: Oral ETT  Additional Equipment:   Intra-op Plan:   Post-operative Plan: Extubation in OR  Informed Consent: I have reviewed the patients History and Physical, chart, labs and discussed the procedure including the risks, benefits and alternatives for the proposed anesthesia with the patient or authorized representative who has indicated his/her understanding and acceptance.     Dental advisory given  Plan Discussed with: CRNA, Anesthesiologist and Surgeon  Anesthesia Plan Comments:        Anesthesia Quick Evaluation

## 2023-12-03 NOTE — Discharge Instructions (Signed)

## 2023-12-04 ENCOUNTER — Encounter (HOSPITAL_COMMUNITY): Payer: Self-pay | Admitting: Urology

## 2023-12-04 DIAGNOSIS — N401 Enlarged prostate with lower urinary tract symptoms: Secondary | ICD-10-CM | POA: Diagnosis not present

## 2023-12-04 LAB — SURGICAL PATHOLOGY

## 2023-12-04 MED ORDER — OXYCODONE HCL 5 MG PO TABS
5.0000 mg | ORAL_TABLET | Freq: Four times a day (QID) | ORAL | 0 refills | Status: AC | PRN
Start: 2023-12-04 — End: ?

## 2023-12-04 NOTE — Discharge Summary (Signed)
 Date of admission: 12/03/2023  Date of discharge: 12/04/2023  Admission diagnosis: BPH with BOO, bladder calculus  Discharge diagnosis: BPH with BOO, bladder calculus  Secondary diagnoses:  Patient Active Problem List   Diagnosis Date Noted   BPH with obstruction/lower urinary tract symptoms 12/03/2023   Psychomotor assessment encounter 09/23/2023   Depression 08/06/2023   Healthcare maintenance 07/08/2023   At risk alcohol consumption 07/08/2023   Tobacco use 07/08/2023   Hypertension 07/08/2023   Lower urinary tract symptoms (LUTS) 07/07/2023   Tourette's syndrome 07/07/2023   AKI (acute kidney injury) (HCC) 07/07/2023   Cervical radiculopathy 07/07/2023    Procedures performed: Procedure(s): CYSTOSCOPY, WITH BLADDER CALCULUS LITHOLAPAXY TURP (TRANSURETHRAL RESECTION OF PROSTATE)  History and Physical: For full details, please see admission history and physical. Briefly, Jake Smith is a 53 y.o. year old patient with bladder calculus and BPH with BOO.   Gen: Laying in bed comfortably Resp: nonlabored respirations GU: Foley with CBI clamped, light yellow urine in tubing  Hospital Course: Patient tolerated the procedure well.  He was then transferred to the floor after an uneventful PACU stay.  His hospital course was uncomplicated.  On POD#1 he had met discharge criteria: was eating a regular diet, was up and ambulating independently,  pain was well controlled, CBI was clamped, and was ready to for discharge.  Patient to go home with foley due to lasering of stone in bladder and some irritation of mucosa seen. Will allow for bladder rest. He has void trial scheduled in the office next week.   Laboratory values:  No results for input(s): "WBC", "HGB", "HCT" in the last 72 hours. No results for input(s): "NA", "K", "CL", "CO2", "GLUCOSE", "BUN", "CREATININE", "CALCIUM" in the last 72 hours. No results for input(s): "LABPT", "INR" in the last 72 hours. No results for  input(s): "LABURIN" in the last 72 hours. Results for orders placed or performed during the hospital encounter of 09/20/23  Urine Culture     Status: None   Collection Time: 09/20/23 11:52 PM   Specimen: Urine, Clean Catch  Result Value Ref Range Status   Specimen Description   Final    URINE, CLEAN CATCH Performed at [facility], [address], [place], [place] [address]    Special Requests   Final    NONE Performed at [facility], [address] [place], [place] [address]    Culture   Final    NO GROWTH Performed at [facility], [address], [place], [place] [address]    Report Status 09/22/2023 FINAL  Final    Disposition: Home  Discharge instruction: The patient was instructed to be ambulatory but told to refrain from heavy lifting, strenuous activity, or driving.   Discharge medications:  Allergies as of 12/04/2023       Reactions   Shellfish Allergy Swelling   Tylenol [acetaminophen] Nausea And Vomiting        Medication List     TAKE these medications    amlodipine-olmesartan 10-20 MG tablet Commonly known as: AZOR Take 1 tablet by mouth daily.   carvedilol 6.25 MG tablet Commonly known as: Coreg Take 1 tablet (6.25 mg total) by mouth 2 (two) times daily.   escitalopram 20 MG tablet Commonly known as: LEXAPRO Take 20 mg by mouth daily.   gabapentin 300 MG capsule Commonly known as: NEURONTIN Take 300 mg by mouth 3 (three) times daily as needed.   haloperidol 10 MG tablet Commonly known  as: HALDOL Take 1 tablet (10 mg total) by mouth 2 (two) times daily. What changed: when to take this   hydrochlorothiazide 25 MG tablet Commonly known as: HYDRODIURIL Take 12.5 mg by mouth daily.   lisinopril 40 MG tablet Commonly known as: ZESTRIL Take 40 mg by mouth daily.   oxyCODONE 5 MG immediate release tablet Commonly known as: Oxy IR/ROXICODONE Take 1 tablet (5 mg total) by mouth every 6 (six) hours  as needed for moderate pain (pain score 4-6).   tamsulosin 0.4 MG Caps capsule Commonly known as: FLOMAX Take 2 capsules (0.8 mg total) by mouth daily after supper. What changed:  how much to take when to take this   varenicline 1 MG tablet Commonly known as: Chantix Continuing Month Pak Take 1 tablet (1 mg total) by mouth 2 (two) times daily.        Followup: April 7 at 9:45am

## 2023-12-10 LAB — CALCULI, WITH PHOTOGRAPH (CLINICAL LAB)
Uric Acid Calculi: 100 %
Weight Calculi: 245 mg

## 2023-12-30 DIAGNOSIS — F429 Obsessive-compulsive disorder, unspecified: Secondary | ICD-10-CM | POA: Diagnosis not present

## 2024-01-05 ENCOUNTER — Ambulatory Visit: Admitting: Student

## 2024-01-05 VITALS — BP 136/90 | HR 70 | Temp 98.4°F | Ht 75.0 in | Wt 191.7 lb

## 2024-01-05 DIAGNOSIS — Z79899 Other long term (current) drug therapy: Secondary | ICD-10-CM | POA: Diagnosis not present

## 2024-01-05 NOTE — Progress Notes (Signed)
 CC:   "I want to donate plasma and need medical clearance."  HPI:  Jake Smith is a 53 y.o. male living with a history stated below and presents today for the above. Please see problem based assessment and plan for additional details.  Past Medical History:  Diagnosis Date   Arthritis    Depression    Dyspnea    GERD (gastroesophageal reflux disease)    Headache    Hypertension    Parkinson's disease (HCC)    Pneumonia    as a child   Sciatica    Tourette's     Current Outpatient Medications on File Prior to Visit  Medication Sig Dispense Refill   amlodipine -olmesartan  (AZOR ) 10-20 MG tablet Take 1 tablet by mouth daily. (Patient not taking: Reported on 11/23/2023) 30 tablet 6   carvedilol  (COREG ) 6.25 MG tablet Take 1 tablet (6.25 mg total) by mouth 2 (two) times daily. (Patient not taking: Reported on 11/23/2023) 60 tablet 11   escitalopram (LEXAPRO) 20 MG tablet Take 20 mg by mouth daily.     gabapentin (NEURONTIN) 300 MG capsule Take 300 mg by mouth 3 (three) times daily as needed. (Patient not taking: Reported on 11/23/2023)     haloperidol  (HALDOL ) 10 MG tablet Take 1 tablet (10 mg total) by mouth 2 (two) times daily. (Patient taking differently: Take 10 mg by mouth daily.) 30 tablet 1   hydrochlorothiazide (HYDRODIURIL) 25 MG tablet Take 12.5 mg by mouth daily.     lisinopril (ZESTRIL) 40 MG tablet Take 40 mg by mouth daily.     oxyCODONE  (OXY IR/ROXICODONE ) 5 MG immediate release tablet Take 1 tablet (5 mg total) by mouth every 6 (six) hours as needed for moderate pain (pain score 4-6). 20 tablet 0   tamsulosin  (FLOMAX ) 0.4 MG CAPS capsule Take 2 capsules (0.8 mg total) by mouth daily after supper. (Patient taking differently: Take 0.4 mg by mouth 2 (two) times daily.) 30 capsule 11   varenicline  (CHANTIX  CONTINUING MONTH PAK) 1 MG tablet Take 1 tablet (1 mg total) by mouth 2 (two) times daily. (Patient not taking: Reported on 09/20/2023) 40 tablet 0   No  current facility-administered medications on file prior to visit.    Family History  Problem Relation Age of Onset   Multiple sclerosis Father     Social History   Socioeconomic History   Marital status: Single    Spouse name: Not on file   Number of children: Not on file   Years of education: Not on file   Highest education level: Not on file  Occupational History   Not on file  Tobacco Use   Smoking status: Every Day    Types: Cigarettes, Cigars    Passive exposure: Current   Smokeless tobacco: Never  Vaping Use   Vaping status: Never Used  Substance and Sexual Activity   Alcohol use: Yes    Alcohol/week: 50.0 standard drinks of alcohol    Types: 50 Cans of beer per week    Comment: 5 days per week   Drug use: Yes    Types: Marijuana    Comment: daily   Sexual activity: Yes  Other Topics Concern   Not on file  Social History Narrative   Not on file   Social Drivers of Health   Financial Resource Strain: Not on file  Food Insecurity: No Food Insecurity (12/03/2023)   Hunger Vital Sign    Worried About Running Out of Food in the Last  Year: Never true    Ran Out of Food in the Last Year: Never true  Transportation Needs: No Transportation Needs (12/03/2023)   PRAPARE - Administrator, Civil Service (Medical): No    Lack of Transportation (Non-Medical): No  Physical Activity: Not on file  Stress: Not on file  Social Connections: Not on file  Intimate Partner Violence: Not At Risk (12/03/2023)   Humiliation, Afraid, Rape, and Kick questionnaire    Fear of Current or Ex-Partner: No    Emotionally Abused: No    Physically Abused: No    Sexually Abused: No    Review of Systems: ROS negative except for what is noted on the assessment and plan.  Vitals:   01/05/24 0831  BP: (!) 142/98  Pulse: 70  Temp: 98.4 F (36.9 C)  TempSrc: Oral  SpO2: 100%  Weight: 191 lb 11.2 oz (87 kg)  Height: 6\' 3"  (1.905 m)    Physical Exam: Constitutional:  well-appearing in NAD HENT: normocephalic atraumatic, mucous membranes moist Eyes: conjunctiva non-erythematous Cardiovascular: regular rate and rhythm, no m/r/g Pulmonary/Chest: normal work of breathing on room air, lungs clear to auscultation bilaterally Abdominal: soft, non-tender, non-distended MSK: normal bulk and tone Neurological: alert & oriented x 3, no focal deficit Skin: warm and dry, track mark on the left (states he donated plasma recently) Psych: anxious, tense   Assessment & Plan:   Patient discussed with Dr. Jarvis Mesa  Patient coming in requesting a letter stating he is stable for donating plasma. He has a history of Tourette's, parkinsonism, alcohol use, and marijuana use.  Last HIV and hepatitis B testing was done 6 months ago but this was negative.  His last tattoo was over 30 years ago.  Denies any drugs.  He does have track marks on his right arm which he senses are related to plastic urination that he did recently.  He brought pain an outside physician communication letter requested from [facility] which states they need a list of medications and approval for the individual to donate plasma regularly.  I went through his dispense records and he is not taking Haldol  or benztropine for his psychiatric conditions.  He is also not taking lisinopril and Coreg  for his hypertension.  He only medications that he is recently picked up were amlodipine  and hydrochlorothiazide as well as Flomax .  He recently had a procedure by urology and was also dispensed oxycodone  for 5 days on 4 April.  Patient has signed a medical release form.  His blood pressure is 136/98, NSR, no active signs of intoxication.  He is medically stable for donation of plasma. Letter to [facility] will be faxed.   Jose Ngo, MD [facility], PGY-1 Phone: [phone] Date 01/05/2024 Time 8:48 AM

## 2024-01-08 NOTE — Progress Notes (Signed)
 Internal Medicine Clinic Attending  Case discussed with the resident at the time of the visit.  We reviewed the resident's history and exam and pertinent patient test results.  I agree with the assessment, diagnosis, and plan of care documented in the resident's note.

## 2024-02-10 DIAGNOSIS — F429 Obsessive-compulsive disorder, unspecified: Secondary | ICD-10-CM | POA: Diagnosis not present

## 2024-03-16 DIAGNOSIS — F429 Obsessive-compulsive disorder, unspecified: Secondary | ICD-10-CM | POA: Diagnosis not present

## 2024-03-30 DIAGNOSIS — F429 Obsessive-compulsive disorder, unspecified: Secondary | ICD-10-CM | POA: Diagnosis not present

## 2024-04-20 DIAGNOSIS — F429 Obsessive-compulsive disorder, unspecified: Secondary | ICD-10-CM | POA: Diagnosis not present

## 2024-07-06 DIAGNOSIS — F429 Obsessive-compulsive disorder, unspecified: Secondary | ICD-10-CM | POA: Diagnosis not present

## 2024-08-17 DIAGNOSIS — F429 Obsessive-compulsive disorder, unspecified: Secondary | ICD-10-CM | POA: Diagnosis not present

## 2024-09-03 ENCOUNTER — Other Ambulatory Visit: Payer: Self-pay | Admitting: Student

## 2024-09-05 NOTE — Telephone Encounter (Signed)
 Medication discontinued 01/05/24 Patient last seen 01/05/24. I called the patient to schedule a appointment. I was unable to reach the patient. I lvm for him to give us  a call back.

## 2024-09-20 ENCOUNTER — Other Ambulatory Visit: Payer: Self-pay | Admitting: Student

## 2024-09-20 NOTE — Telephone Encounter (Signed)
 Medication discontinued 01/05/24.

## 2024-09-29 ENCOUNTER — Ambulatory Visit: Payer: Self-pay

## 2024-09-29 VITALS — BP 180/102 | HR 64 | Temp 98.4°F | Ht 75.0 in | Wt 196.2 lb

## 2024-09-29 DIAGNOSIS — Z720 Tobacco use: Secondary | ICD-10-CM

## 2024-09-29 DIAGNOSIS — F339 Major depressive disorder, recurrent, unspecified: Secondary | ICD-10-CM | POA: Diagnosis not present

## 2024-09-29 DIAGNOSIS — Z1211 Encounter for screening for malignant neoplasm of colon: Secondary | ICD-10-CM

## 2024-09-29 DIAGNOSIS — I10 Essential (primary) hypertension: Secondary | ICD-10-CM | POA: Diagnosis present

## 2024-09-29 DIAGNOSIS — F1090 Alcohol use, unspecified, uncomplicated: Secondary | ICD-10-CM

## 2024-09-29 DIAGNOSIS — Z0000 Encounter for general adult medical examination without abnormal findings: Secondary | ICD-10-CM

## 2024-09-29 DIAGNOSIS — F17210 Nicotine dependence, cigarettes, uncomplicated: Secondary | ICD-10-CM

## 2024-09-29 DIAGNOSIS — R399 Unspecified symptoms and signs involving the genitourinary system: Secondary | ICD-10-CM

## 2024-09-29 DIAGNOSIS — Z9189 Other specified personal risk factors, not elsewhere classified: Secondary | ICD-10-CM

## 2024-09-29 MED ORDER — TAMSULOSIN HCL 0.4 MG PO CAPS: 0.8000 mg | ORAL_CAPSULE | Freq: Every day | ORAL | 11 refills | Status: AC

## 2024-09-29 MED ORDER — ESCITALOPRAM OXALATE 20 MG PO TABS: 20.0000 mg | ORAL_TABLET | Freq: Every day | ORAL | 3 refills | Status: AC

## 2024-09-29 MED ORDER — AMLODIPINE-OLMESARTAN 10-20 MG PO TABS: 1.0000 | ORAL_TABLET | Freq: Every day | ORAL | 3 refills | Status: AC

## 2024-09-29 MED ORDER — SILDENAFIL CITRATE 20 MG PO TABS: 20.0000 mg | ORAL_TABLET | Freq: Every day | ORAL | 1 refills | Status: AC | PRN

## 2024-09-29 NOTE — Assessment & Plan Note (Addendum)
 BP 175/105 and 180/102 on repeat today.  Patient has not taken his blood pressure medicine in a couple days because he was unable to get a refill which led to him having this appointment scheduled.  He is not able to verbalize which blood pressure medicine he is on, per chart I believe he is taking amlodipine -olmesartan  10-20 mg daily.  Today he reports feeling well.  Will refill this today and encouraged healthy diet and physical activity.  I do not see records of a lipid panel or A1c.  He does have diabetes in his family and has a history of tobacco use disorder.  He also drinks at least 4 days a week.  Will get basic labs today to assess his baseline and ASCVD risk.  Discussed that if his risk is high I may recommend a cholesterol-lowering medicine. Plan Continue amlodipine -olmesartan  10-20 mg daily Follow-up labs Orders:   amlodipine -olmesartan  (AZOR ) 10-20 MG tablet; Take 1 tablet by mouth daily.   Comprehensive metabolic panel with GFR   Lipid Profile   CBC with Diff   Hemoglobin A1c

## 2024-09-29 NOTE — Assessment & Plan Note (Addendum)
 He follows regularly with therapy and he feels like he has made great improvement.  He prefers to take his Lexapro  every other day. Plan Continue current regimen Continue with therapy Orders:   escitalopram  (LEXAPRO ) 20 MG tablet; Take 1 tablet (20 mg total) by mouth daily.

## 2024-09-29 NOTE — Patient Instructions (Signed)
 It was wonderful seeing you today!   1) I refilled your blood pressure medicine, please take one tablet daily   2) Also refilled your sildenafil , Lexapro  and Flomax .   3) I will call you with the results of your labs.  4) Send us  a copy of your colonoscopy results when you can.   5) See you in 6 months!!  If you have any questions please feel free to the call the clinic at anytime at [phone].  Have a blessed day,  Dr. Charmayne

## 2024-09-29 NOTE — Assessment & Plan Note (Addendum)
 In 12/2023 he had a cystoscopy for bladder calculus as well as a TURP for BPH with BOO.  Today he denies any issues with urination, he is very pleased with the results of the surgery. Plan Refilled tamsulosin  and sildenafil  today, both previously prescribed by urology Orders:   tamsulosin  (FLOMAX ) 0.4 MG CAPS capsule; Take 2 capsules (0.8 mg total) by mouth daily after supper.   sildenafil  (REVATIO ) 20 MG tablet; Take 1 tablet (20 mg total) by mouth daily as needed.

## 2024-09-29 NOTE — Progress Notes (Signed)
 "  Established Patient Office Visit  Subjective   Patient ID: Jake Smith, male    DOB: 1971-08-02  Age: 54 y.o. MRN: [id number]  Patient here for medication refill and annual check-in.  He has no complaints.   Past Medical History:  Diagnosis Date   Arthritis    Depression    Dyspnea    GERD (gastroesophageal reflux disease)    Headache    Hypertension    Parkinson's disease (HCC)    Pneumonia    as a child   Sciatica    Tourette's    Past Surgical History:  Procedure Laterality Date   CYSTOSCOPY WITH LITHOLAPAXY N/A 12/03/2023   Procedure: CYSTOSCOPY, WITH BLADDER CALCULUS LITHOLAPAXY;  Surgeon: Elisabeth Valli BIRCH, MD;  Location: [facility];  Service: Urology;  Laterality: N/A;   HERNIA REPAIR     TRANSURETHRAL RESECTION OF PROSTATE N/A 12/03/2023   Procedure: TURP (TRANSURETHRAL RESECTION OF PROSTATE);  Surgeon: Elisabeth Valli BIRCH, MD;  Location: [facility];  Service: Urology;  Laterality: N/A;        Objective:     BP (!) 180/102 (Patient Position: Sitting, Cuff Size: Normal)   Pulse 64   Temp 98.4 F (36.9 C) (Oral)   Ht 6' 3 (1.905 m)   Wt 196 lb 3.2 oz (89 kg)   SpO2 93%   BMI 24.52 kg/m  BP Readings from Last 3 Encounters:  09/29/24 (!) 180/102  01/05/24 (!) 136/90  12/04/23 (!) 154/92   Wt Readings from Last 3 Encounters:  09/29/24 196 lb 3.2 oz (89 kg)  01/05/24 191 lb 11.2 oz (87 kg)  12/03/23 193 lb (87.5 kg)     Physical Exam Vitals reviewed.  Constitutional:      Appearance: Normal appearance. He is normal weight.  HENT:     Nose: Nose normal.     Mouth/Throat:     Mouth: Mucous membranes are moist.     Pharynx: Oropharynx is clear.  Eyes:     Conjunctiva/sclera: Conjunctivae normal.  Cardiovascular:     Rate and Rhythm: Normal rate and regular rhythm.     Pulses: Normal pulses.     Heart sounds: Normal heart sounds.  Pulmonary:     Effort: Pulmonary effort is normal.     Breath sounds: No wheezing.  Musculoskeletal:        General:  Normal range of motion.  Skin:    General: Skin is warm.  Neurological:     General: No focal deficit present.     Mental Status: He is alert and oriented to person, place, and time.  Psychiatric:        Mood and Affect: Mood normal.        Behavior: Behavior normal.     No results found for any visits on 09/29/24.  The ASCVD Risk score (Arnett DK, et al., 2019) failed to calculate for the following reasons:   Cannot find a previous HDL lab   Cannot find a previous total cholesterol lab   * - Cholesterol units were assumed    Assessment & Plan:   Assessment & Plan Hypertension, unspecified type BP 175/105 and 180/102 on repeat today.  Patient has not taken his blood pressure medicine in a couple days because he was unable to get a refill which led to him having this appointment scheduled.  He is not able to verbalize which blood pressure medicine he is on, per chart I believe he is taking amlodipine -olmesartan  10-20 mg daily.  Today he reports feeling well.  Will refill this today and encouraged healthy diet and physical activity.  I do not see records of a lipid panel or A1c.  He does have diabetes in his family and has a history of tobacco use disorder.  He also drinks at least 4 days a week.  Will get basic labs today to assess his baseline and ASCVD risk.  Discussed that if his risk is high I may recommend a cholesterol-lowering medicine. Plan Continue amlodipine -olmesartan  10-20 mg daily Follow-up labs Orders:   amlodipine -olmesartan  (AZOR ) 10-20 MG tablet; Take 1 tablet by mouth daily.   Comprehensive metabolic panel with GFR   Lipid Profile   CBC with Diff   Hemoglobin A1c  Episode of recurrent major depressive disorder, unspecified depression episode severity He follows regularly with therapy and he feels like he has made great improvement.  He prefers to take his Lexapro  every other day. Plan Continue current regimen Continue with therapy Orders:   escitalopram   (LEXAPRO ) 20 MG tablet; Take 1 tablet (20 mg total) by mouth daily.  At risk alcohol consumption Drinks about 4 beers, 4 days a week.  He does not drink every day.  He is not interested in cutting down and does not feel like his drinking is a problem for him. Plan Reassess stage of change at follow-up appointment    Tobacco use He is down to only one pack every 3 or so days.  Encouraged him to keep up the great work. Plan Encourage cessation Offer medication assistance at future visits    Lower urinary tract symptoms (LUTS) In 12/2023 he had a cystoscopy for bladder calculus as well as a TURP for BPH with BOO.  Today he denies any issues with urination, he is very pleased with the results of the surgery. Plan Refilled tamsulosin  and sildenafil  today, both previously prescribed by urology Orders:   tamsulosin  (FLOMAX ) 0.4 MG CAPS capsule; Take 2 capsules (0.8 mg total) by mouth daily after supper.   sildenafil  (REVATIO ) 20 MG tablet; Take 1 tablet (20 mg total) by mouth daily as needed.  Colon cancer screening He had a colonoscopy although we do not have records of it in our system.  He is going to try to find a copy and send it to our office. Plan Follow-up on colonoscopy records    Healthcare maintenance He says that he has had the flu shot for this year and a recent pneumonia vaccine although we do not have records of this.      Return in about 6 months (around 03/29/2025).    Viktoria King, DO "

## 2024-09-29 NOTE — Assessment & Plan Note (Signed)
 He says that he has had the flu shot for this year and a recent pneumonia vaccine although we do not have records of this.

## 2024-09-29 NOTE — Assessment & Plan Note (Addendum)
 Drinks about 4 beers, 4 days a week.  He does not drink every day.  He is not interested in cutting down and does not feel like his drinking is a problem for him. Plan Reassess stage of change at follow-up appointment

## 2024-09-29 NOTE — Assessment & Plan Note (Addendum)
 He is down to only one pack every 3 or so days.  Encouraged him to keep up the great work. Plan Encourage cessation Offer medication assistance at future visits

## 2024-09-30 ENCOUNTER — Ambulatory Visit: Payer: Self-pay

## 2024-09-30 LAB — COMPREHENSIVE METABOLIC PANEL WITH GFR
ALT: 15 [IU]/L (ref 0–44)
AST: 22 [IU]/L (ref 0–40)
Albumin: 4.1 g/dL (ref 3.8–4.9)
Alkaline Phosphatase: 52 [IU]/L (ref 47–123)
BUN/Creatinine Ratio: 13 (ref 9–20)
BUN: 18 mg/dL (ref 6–24)
Bilirubin Total: 0.3 mg/dL (ref 0.0–1.2)
CO2: 23 mmol/L (ref 20–29)
Calcium: 9.7 mg/dL (ref 8.7–10.2)
Chloride: 105 mmol/L (ref 96–106)
Creatinine, Ser: 1.34 mg/dL — ABNORMAL HIGH (ref 0.76–1.27)
Globulin, Total: 2.2 g/dL (ref 1.5–4.5)
Glucose: 82 mg/dL (ref 70–99)
Potassium: 5.1 mmol/L (ref 3.5–5.2)
Sodium: 141 mmol/L (ref 134–144)
Total Protein: 6.3 g/dL (ref 6.0–8.5)
eGFR: 63 mL/min/{1.73_m2}

## 2024-09-30 LAB — CBC WITH DIFFERENTIAL/PLATELET
Basophils Absolute: 0.1 10*3/uL (ref 0.0–0.2)
Basos: 1 %
EOS (ABSOLUTE): 0.2 10*3/uL (ref 0.0–0.4)
Eos: 1 %
Hematocrit: 48.8 % (ref 37.5–51.0)
Hemoglobin: 15.4 g/dL (ref 13.0–17.7)
Immature Grans (Abs): 0.1 10*3/uL (ref 0.0–0.1)
Immature Granulocytes: 1 %
Lymphocytes Absolute: 2 10*3/uL (ref 0.7–3.1)
Lymphs: 19 %
MCH: 29.5 pg (ref 26.6–33.0)
MCHC: 31.6 g/dL (ref 31.5–35.7)
MCV: 94 fL (ref 79–97)
Monocytes Absolute: 1 10*3/uL — ABNORMAL HIGH (ref 0.1–0.9)
Monocytes: 9 %
Neutrophils Absolute: 7.3 10*3/uL — ABNORMAL HIGH (ref 1.4–7.0)
Neutrophils: 69 %
Platelets: 313 10*3/uL (ref 150–450)
RBC: 5.22 x10E6/uL (ref 4.14–5.80)
RDW: 13.1 % (ref 11.6–15.4)
WBC: 10.6 10*3/uL (ref 3.4–10.8)

## 2024-09-30 LAB — LIPID PANEL
Chol/HDL Ratio: 2.6 ratio (ref 0.0–5.0)
Cholesterol, Total: 175 mg/dL (ref 100–199)
HDL: 67 mg/dL
LDL Chol Calc (NIH): 98 mg/dL (ref 0–99)
Triglycerides: 50 mg/dL (ref 0–149)
VLDL Cholesterol Cal: 10 mg/dL (ref 5–40)

## 2024-09-30 LAB — HEMOGLOBIN A1C
Est. average glucose Bld gHb Est-mCnc: 114 mg/dL
Hgb A1c MFr Bld: 5.6 % (ref 4.8–5.6)

## 2024-09-30 MED ORDER — ROSUVASTATIN CALCIUM 20 MG PO TABS: 20.0000 mg | ORAL_TABLET | Freq: Every day | ORAL | 11 refills | Status: AC

## 2024-09-30 NOTE — Progress Notes (Signed)
 Internal Medicine Clinic Attending  Case discussed with the resident at the time of the visit.  We reviewed the resident's history and exam and pertinent patient test results.  I agree with the assessment, diagnosis, and plan of care documented in the resident's note.
